# Patient Record
Sex: Female | Born: 1955
Health system: Southern US, Community
[De-identification: ages and names within clinical notes are randomized; demographics above are authoritative.]

## PROBLEM LIST (undated history)

## (undated) ENCOUNTER — Inpatient Hospital Stay (INDEPENDENT_AMBULATORY_CARE_PROVIDER_SITE_OTHER): Payer: 59 | Admitting: Podiatry

## (undated) DIAGNOSIS — E7800 Pure hypercholesterolemia, unspecified: Secondary | ICD-10-CM

## (undated) DIAGNOSIS — I10 Essential (primary) hypertension: Secondary | ICD-10-CM

## (undated) HISTORY — PX: FOOT SURGERY: SHX648

---

## 2003-03-28 ENCOUNTER — Other Ambulatory Visit: Admission: RE | Admit: 2003-03-28 | Discharge: 2003-03-28 | Payer: Self-pay | Admitting: Obstetrics and Gynecology

## 2003-04-16 ENCOUNTER — Ambulatory Visit (HOSPITAL_COMMUNITY): Admission: RE | Admit: 2003-04-16 | Discharge: 2003-04-16 | Payer: Self-pay | Admitting: Obstetrics and Gynecology

## 2003-04-16 ENCOUNTER — Encounter: Payer: Self-pay | Admitting: Obstetrics and Gynecology

## 2003-05-14 ENCOUNTER — Encounter (INDEPENDENT_AMBULATORY_CARE_PROVIDER_SITE_OTHER): Payer: Self-pay | Admitting: Specialist

## 2003-05-14 ENCOUNTER — Ambulatory Visit (HOSPITAL_COMMUNITY): Admission: RE | Admit: 2003-05-14 | Discharge: 2003-05-14 | Payer: Self-pay | Admitting: Obstetrics and Gynecology

## 2006-09-16 ENCOUNTER — Encounter: Admission: RE | Admit: 2006-09-16 | Discharge: 2006-09-16 | Payer: Self-pay | Admitting: Orthopedic Surgery

## 2007-06-25 ENCOUNTER — Emergency Department (HOSPITAL_COMMUNITY): Admission: EM | Admit: 2007-06-25 | Discharge: 2007-06-25 | Payer: Self-pay | Admitting: Emergency Medicine

## 2008-02-16 ENCOUNTER — Observation Stay (HOSPITAL_COMMUNITY): Admission: EM | Admit: 2008-02-16 | Discharge: 2008-02-17 | Payer: Self-pay | Admitting: Emergency Medicine

## 2008-02-16 ENCOUNTER — Encounter (INDEPENDENT_AMBULATORY_CARE_PROVIDER_SITE_OTHER): Payer: Self-pay | Admitting: Surgery

## 2010-12-21 ENCOUNTER — Other Ambulatory Visit (HOSPITAL_COMMUNITY)
Admission: RE | Admit: 2010-12-21 | Discharge: 2010-12-21 | Disposition: A | Discharge status: Home / Self Care | Payer: 59 | Source: Ambulatory Visit | Attending: Pediatric Emergency Medicine | Admitting: Pediatric Emergency Medicine

## 2010-12-21 ENCOUNTER — Other Ambulatory Visit: Payer: Self-pay | Admitting: Family Medicine

## 2010-12-21 DIAGNOSIS — Z124 Encounter for screening for malignant neoplasm of cervix: Secondary | ICD-10-CM | POA: Insufficient documentation

## 2010-12-29 NOTE — Op Note (Signed)
Shelia Sanford, Shelia Sanford NO.:  [id number]   MEDICAL RECORD NO.:  [id number]          PATIENT TYPE:  OBV   LOCATION:  5126                         FACILITY:  [facility]   PHYSICIAN:  Velora Heckler, MD      DATE OF BIRTH:  [birth year]   DATE OF PROCEDURE:  DATE OF DISCHARGE:                               OPERATIVE REPORT   PREOPERATIVE DIAGNOSIS:  Acute appendicitis.   POSTOPERATIVE DIAGNOSIS:  Acute appendicitis.   PROCEDURE:  Laparoscopic appendectomy.   SURGEON:  Velora Heckler, MD, FACS   ANESTHESIA:  General per Dr. Laverle Hobby.   ESTIMATED BLOOD LOSS:  Minimal.   PREPARATION:  Betadine.   COMPLICATIONS:  None.   INDICATIONS:  The patient is a 55 year old white female presents to the  emergency department with 2-day history of nausea, right-sided abdominal  pain, and fever.  The patient was evaluated and noted to have elevated  white blood cell count.  CT scan of the abdomen and pelvis confirmed a  diagnosis of acute appendicitis.  General surgery was called for  management.   BODY OF REPORT:  Procedure was done in OR #70 at the [facility].  The patient was brought to the operating room,  placed in supine position on the operating room table.  Following  administration of general anesthesia, the patient was prepped and draped  in the usual strict aseptic fashion.  After ascertaining that an  adequate level of anesthesia had been achieved, an infraumbilical  incision was made in the midline with #15 blade.  Dissection was carried  down to the fascia.  Fascia was incised in the midline, and the  peritoneal cavity was entered cautiously.  A 0 Vicryl pursestring suture  was placed in the fascia.  A Hasson cannula was introduced and secured  with a pursestring suture.  Abdomen was insufflated with carbon dioxide.  Laparoscope was introduced in the abdomen and explored.  Operative ports  were placed in the right upper quadrant and left lower  quadrant.  The  patient was positioned and the omentum was mobilized out of the right  lower quadrant of the abdomen.  Appendix was visualized.  It was acutely  inflamed and indurated.  There was no sign of perforation nor abscess.  Mesoappendix was taken down using the harmonic scalpel for hemostasis.  Appendix was dissected up to level of the cecal wall.  Base of the  appendix was dissected out again using the harmonic scalpel for  hemostasis.  Base of the appendix was then transected at its junction  with the cecal wall with an EndoGIA type stapler.  Good hemostasis was  noted at the staple line.  Good hemostasis was achieved along the  mesoappendix with the harmonic scalpel.  Appendix was placed into an  EndoCatch bag and withdrawn through the umbilical port without  difficulty.  Right lower quadrant was irrigated with warm saline.  Good  hemostasis was noted.  Saline was evacuated from the right lower  quadrant and pelvis.  Ports were removed under direct vision  and good  hemostasis was noted at the port sites.  A 0 Vicryl pursestring suture  was tied securely.  Port sites were anesthetized with local anesthetic.  Wounds were  closed with interrupted 4-0 Monocryl subcuticular sutures.  Wounds were  washed and dried and Benzoin and Steri-Strips were applied.  Sterile  dressings were applied.  The patient was awakened from anesthesia and  brought to the recovery room in stable condition.  The patient tolerated  the procedure well.      Velora Heckler, MD  Electronically Signed     TMG/MEDQ  D:  02/16/2008  T:  02/17/2008  Job:  [id number]   cc:   Dwana Curd. Para March, M.D.  [employer]

## 2010-12-29 NOTE — H&P (Signed)
NAMELITSY, EPTING NO.:  [id number]   MEDICAL RECORD NO.:  [id number]          PATIENT TYPE:  OBV   LOCATION:  5126                         FACILITY:  [facility]   PHYSICIAN:  Velora Heckler, MD      DATE OF BIRTH:  07-06-1956   DATE OF ADMISSION:  02/16/2008  DATE OF DISCHARGE:                              HISTORY & PHYSICAL   CHIEF COMPLAINT:  Abdominal pain, acute appendicitis.   HISTORY OF PRESENT ILLNESS:  The patient is a 55 year old white female  from [place], [place].  She presents to the emergency  department with 2-day history of developing right-sided abdominal pain  and nausea.  The patient developed fever to 101.7 degrees at home this  morning.  She has had intermittent episodes of diarrhea for  approximately 1 week.  The patient was seen and evaluated in the  emergency department.  Laboratory studies were obtained showing an  elevated white blood cell count of 11.9 with a left shift.  CT scan  abdomen and pelvis was obtained with findings consistent with acute  appendicitis.  General Surgery is called for management.   PAST MEDICAL HISTORY:  Status post cesarean section, status post removal  of fibroid tumors, status post foot surgery, history of hypertension,  and history of hypercholesterolemia.   MEDICATIONS:  Xanax, lisinopril, amlodipine, and simvastatin.   ALLERGIES:  To SULFA causing tongue swelling.   SOCIAL HISTORY:  The patient is married with one child.  She does not  smoke.  She drinks wine on a daily basis.  She works at [employer] in  [place].   PRIMARY DOCTOR:  Dr. Crawford Givens at [facility].   FAMILY HISTORY:  Noncontributory.   REVIEW OF SYSTEMS:  15 system reviewed without significant other finding  except as noted above.   PHYSICAL EXAMINATION:  GENERAL:  A 55 year old moderately obese white  female in mild discomfort, on a stretcher in the emergency department.  VITAL SIGNS:  Temperature 98.8,  pulse 107, respirations 18, blood  pressure 124/64.  HEENT:  Normocephalic and atraumatic.  Sclerae clear.  Conjunctivae  clear.  Pupils equal and reactive.  Dentition good.  Mucous membranes  moist.  Voice normal.  NECK:  Palpation of the anterior neck shows no thyroid nodularity.  No  lymphadenopathy.  No tenderness.  Peripheral pulses are full.  EXTREMITIES:  Nontender without edema.  CHEST:  Auscultation of the chest shows good breath sounds bilaterally  without rales, rhonchi, or wheeze.  CARDIAC:  Regular rate and rhythm without murmur.  ABDOMEN:  Soft, obese with well-healed lower midline incision.  Palpation of the upper abdomen shows no significant tenderness.  However, in the right mid and lower abdomen, there is obvious tenderness  with voluntary guarding.  No palpable mass.  No sign of hernia.  No  hepatosplenomegaly.  NEUROLOGICAL:  The patient is alert and oriented without focal deficit.   LABORATORY STUDIES:  White count 11.9, hemoglobin 13.8, hematocrit  39.4%, platelet count 271,000.  Differential shows 86% neutrophils, 8%  lymphocytes, 5% monocytes.  Electrolytes are  normal.  Creatinine 0.8.  Urine pregnancy test negative.  Urinalysis negative.   RADIOGRAPHIC STUDIES:  CT scan of abdomen and pelvis showing enhancement  of the appendiceal wall and fluid attenuation centrally.  There is cecal  thickening.  There is periappendiceal inflammation, all of which is felt  to be consistent with acute appendicitis.   IMPRESSION:  Acute appendicitis.   PLAN:  The patient will be admitted on the General Surgical Service at  [facility].  The patient will be started on intravenous  antibiotics.  Operating room has been notified, and the patient will be  prepared urgently for surgery for laparoscopic appendectomy.  The  patient and her family and I have discussed laparoscopic technique  versus open surgery.  I quoted them approximately a 10% chance of  conversion to  open surgery.  We discussed her hospital stay and her  return to work.  She understands and wishes to proceed.      Velora Heckler, MD  Electronically Signed     TMG/MEDQ  D:  02/16/2008  T:  02/16/2008  Job:  [id number]   cc:   Jettie Pagan, MD  Dwana Curd Para March, M.D.

## 2011-01-01 NOTE — Op Note (Signed)
NAME:  Shelia Sanford, Shelia Sanford                             ACCOUNT NO.:  [id number]   MEDICAL RECORD NO.:  [id number]                   PATIENT TYPE:  AMB   LOCATION:  SDC                                  [facility]AC[facility]:  [facility]   PHYSICIAN:  Richardean Sale, M.D.                DATE OF BIRTH:  Sep 29, 1955   DATE OF PROCEDURE:  05/14/2003  DATE OF DISCHARGE:                                 OPERATIVE REPORT   PREOPERATIVE DIAGNOSIS:  Menomenorrhagia secondary to submucosal uterine  fibroids.   POSTOPERATIVE DIAGNOSIS:  Menomenorrhagia secondary to submucosal uterine  fibroids.   PROCEDURE:  Hysteroscopy with resection of submucosal fibroid and D&C.   FINDINGS:  A 2 cm submucosal fibroid in the right cornua, small benign-  appearing endometrial polyps.  Uterus sounds to 9.5 cm.   INDICATIONS FOR PROCEDURE:  This is a 55 year old white female with a recent  history of menomenorrhagia with menses occurring every two weeks and being  extremely heavy, soaking through pads and through tampons.  Sonohistogram  was performed at the office which revealed a submucosal fibroid.  Endometrial biopsy was performed and was negative for any premalignant or  malignant cells.  Therefore, the patient was offered hysterectomy versus  hysteroscopic resection and desired outpatient management.  Prior to the  procedure, the risks of the procedure including, but not limited to,  hemorrhage, infection, injury to the uterus requiring further surgery,  possible exploratory laparotomy to rule out gallbladder or other intra-  abdominal organ injury, DVT and anesthetic related complications as well as  a 50% chance of recurrence of the fibroid and symptoms.  The patient voices  understanding of these risks, consent was signed and all questions were  answered.   DESCRIPTION OF PROCEDURE:  The patient was taken to the operating room and  given general anesthesia, was placed in the dorsal lithotomy position and  prepped and draped  in the usual sterile fashion with Hibiclens.  A red  rubber catheter was then used to drain the bladder.  A speculum was placed  in the vagina and 12 mL of Nesacaine were injected circumferentially for a  paracervical block for postoperative analgesia.  A single-tooth tenaculum  was placed on the anterior lip of the cervix and the cervix was then dilated  with the [employer] dilators.  The hysteroscope was then introduced using 3%  Sorbitol and findings were revealed as noted above.  At this point, the  double wire loop was used to resect the uterine fibroid.  This was performed  without much difficulty.  Small endometrial polyps were removed as well.  The resection was completed.  This was followed by sharp curettage with the  curettings sent to pathology.  The single-tooth tenaculum was removed from  the cervix.  There was no bleeding from the tenaculum site and very minimal  bleeding from the os.  The patient tolerated the  procedure well.  Sponge,  lap and instrument counts correct x 2.  Fluid deficit was 220 mL per  recording on the machine but there was approximately 100 mL of fluid on the  operating room floor, the total deficit was then estimated at 120 mL.   The patient tolerated the procedure well.  She was taken out of general  anesthesia and taken to the recovery room in stable condition.  There were  no complications.                                               Richardean Sale, M.D.   JW/MEDQ  D:  05/14/2003  T:  05/14/2003  Job:  [id number]

## 2011-01-01 NOTE — H&P (Signed)
NAME:  Shelia Sanford, Shelia Sanford                             ACCOUNT NO.:  [id number]   MEDICAL RECORD NO.:  [id number]                   PATIENT TYPE:  AMB   LOCATION:  SDC                                  [facility]AC[facility]   PHYSICIAN:  Richardean Sale, M.D.                DATE OF BIRTH:  1956/02/02   DATE OF ADMISSION:  05/14/2003  DATE OF DISCHARGE:                                HISTORY & PHYSICAL   CHIEF COMPLAINT:  Irregular menstrual cycles with heavy bleeding.   HISTORY OF PRESENT ILLNESS:  This is a 54 year old gravida 1, para 1 white  female with a six month history of increasing menstrual flow and menstrual  cycle frequency which has been refractory to hormonal therapy.  The patient  underwent an ultrasound and a sonohysterogram which revealed a 2 cm  submucosal fibroid at the uterine fundus as well as a possible small polyp.  Endometrial biopsy was performed which was negative for any atypia or  malignancy.  Ultrasound also revealed three small anterior uterine fibroids  that did not appear submucosal and are all less than 2 cm in size.  The  patient's bleeding is affecting her daily life.  She occasionally bleeds  through both on maxi pad and tampon and complains of excessive fatigue.  Recent Pap smear was normal.  Mammogram was also normal.   PAST MEDICAL HISTORY:  None.   PAST SURGICAL HISTORY:  1. Cesarean section.  2. Tonsillectomy.   SOCIAL HISTORY:  Does not smoke.  Uses only occasional alcohol socially.  No  illegal drugs.  She is married.   FAMILY HISTORY:  Significant for mother with lung cancer, grandmother with  ALS, and grandfather with lymphoma.  No history of blood clotting or  bleeding disorders.  No breast, ovarian, or uterine cancer.   MEDICATIONS:  None.   ALLERGIES:  SULFA.   REVIEW OF SYSTEMS:  No chest pain, shortness of breath, abdominal pain.  No  changes in bowel or bladder habits.  All negative with the exception  bleeding noted in the HPI.   PHYSICAL EXAMINATION:  GENERAL:  She is a well-developed, well-nourished  white female who appears in no acute distress.  NECK:  Neck and thyroid are within normal limits.  LUNGS:  Clear to auscultation bilaterally.  CARDIOVASCULAR:  Heart is regular rate and rhythm.  No murmurs, rubs, or  gallops appreciated.  ABDOMEN:  Soft, nontender, nondistended.  There is a healed cesarean section  scar and no appreciable masses.  No hernia.  Liver, spleen are normal.  PELVIC:  External genitalia are within normal limits.  Speculum examination  reveals a normal appearing cervix with a moderate amount of blood in the  vagina.  Bimanual examination:  There is no cervical motion tenderness.  Uterus is anteverted, normal size, mobile, and nontender.  There are no  adnexal masses.  No adnexal tenderness.  EXTREMITIES:  No edema, nontender.  NEUROLOGIC:  Nonfocal.   ASSESSMENT:  A 55 year old gravida 1, para 1 white female with  menometrorrhagia likely due to submucosal fibroids and/or endometrial polyp.   PLAN:  Will proceed with hysteroscopy with resection of uterine fibroids  and/or polyps as well as ablation of the endometrium.                                               Richardean Sale, M.D.    JW/MEDQ  D:  05/10/2003  T:  05/10/2003  Job:  [id number]

## 2011-05-13 LAB — URINALYSIS, ROUTINE W REFLEX MICROSCOPIC
Glucose, UA: NEGATIVE
pH: 6.5

## 2011-05-13 LAB — CBC
HCT: 39.4
Platelets: 271
RBC: 4.41
WBC: 11.9 — ABNORMAL HIGH

## 2011-05-13 LAB — POCT I-STAT, CHEM 8
Calcium, Ion: 1.15
HCT: 42
Sodium: 135
TCO2: 25

## 2011-05-13 LAB — DIFFERENTIAL
Lymphocytes Relative: 8 — ABNORMAL LOW
Lymphs Abs: 1
Monocytes Relative: 5
Neutrophils Relative %: 86 — ABNORMAL HIGH

## 2011-05-13 LAB — PREGNANCY, URINE: Preg Test, Ur: NEGATIVE

## 2014-06-13 ENCOUNTER — Ambulatory Visit (HOSPITAL_COMMUNITY)
Admission: RE | Admit: 2014-06-13 | Discharge: 2014-06-13 | Disposition: A | Discharge status: Home / Self Care | Payer: 59 | Source: Ambulatory Visit | Attending: Chiropractic Medicine | Admitting: Chiropractic Medicine

## 2014-06-13 ENCOUNTER — Ambulatory Visit (HOSPITAL_COMMUNITY)
Admission: RE | Admit: 2014-06-13 | Discharge: 2014-06-13 | Disposition: A | Discharge status: Home / Self Care | Payer: 59 | Source: Ambulatory Visit | Attending: Diagnostic Radiology | Admitting: Diagnostic Radiology

## 2014-06-13 ENCOUNTER — Other Ambulatory Visit (HOSPITAL_COMMUNITY): Payer: Self-pay | Admitting: Chiropractic Medicine

## 2014-06-13 DIAGNOSIS — M545 Low back pain: Secondary | ICD-10-CM | POA: Insufficient documentation

## 2014-06-13 DIAGNOSIS — M25551 Pain in right hip: Secondary | ICD-10-CM | POA: Diagnosis present

## 2014-08-11 ENCOUNTER — Encounter (HOSPITAL_COMMUNITY): Payer: Self-pay | Admitting: *Deleted

## 2014-08-11 ENCOUNTER — Emergency Department (HOSPITAL_COMMUNITY)
Admission: EM | Admit: 2014-08-11 | Discharge: 2014-08-11 | Disposition: A | Discharge status: Home / Self Care | Payer: 59 | Attending: Emergency Medicine | Admitting: Emergency Medicine

## 2014-08-11 DIAGNOSIS — E780 Pure hypercholesterolemia: Secondary | ICD-10-CM | POA: Insufficient documentation

## 2014-08-11 DIAGNOSIS — Y998 Other external cause status: Secondary | ICD-10-CM | POA: Diagnosis not present

## 2014-08-11 DIAGNOSIS — Z79899 Other long term (current) drug therapy: Secondary | ICD-10-CM | POA: Diagnosis not present

## 2014-08-11 DIAGNOSIS — W260XXA Contact with knife, initial encounter: Secondary | ICD-10-CM | POA: Diagnosis not present

## 2014-08-11 DIAGNOSIS — Y9389 Activity, other specified: Secondary | ICD-10-CM | POA: Diagnosis not present

## 2014-08-11 DIAGNOSIS — I10 Essential (primary) hypertension: Secondary | ICD-10-CM | POA: Insufficient documentation

## 2014-08-11 DIAGNOSIS — Z791 Long term (current) use of non-steroidal anti-inflammatories (NSAID): Secondary | ICD-10-CM | POA: Insufficient documentation

## 2014-08-11 DIAGNOSIS — S61211A Laceration without foreign body of left index finger without damage to nail, initial encounter: Secondary | ICD-10-CM | POA: Diagnosis not present

## 2014-08-11 DIAGNOSIS — Y9289 Other specified places as the place of occurrence of the external cause: Secondary | ICD-10-CM | POA: Diagnosis not present

## 2014-08-11 DIAGNOSIS — S61219A Laceration without foreign body of unspecified finger without damage to nail, initial encounter: Secondary | ICD-10-CM

## 2014-08-11 HISTORY — DX: Pure hypercholesterolemia, unspecified: E78.00

## 2014-08-11 HISTORY — DX: Essential (primary) hypertension: I10

## 2014-08-11 MED ORDER — LIDOCAINE HCL 1 % IJ SOLN
30.0000 mL | Freq: Once | INTRAMUSCULAR | Status: AC
  Administered 2014-08-11: 30 mL
  Filled 2014-08-11: qty 40

## 2014-08-11 NOTE — ED Provider Notes (Signed)
CSN: [id number]     Arrival date & time 08/11/14  1300 History   None    Chief Complaint  Patient presents with  . finger laceration    The history is provided by the patient. No language interpreter was used.   This chart was scribed for non-physician practitioner Marlon Peliffany Rubena Roseman, PA-C,  working with Flint MelterElliott L Wentz, MD, by Andrew Auaven Small, ED Scribe. This patient was seen in room WTR6/WTR6 and the patient's care was started at 2:18 PM.  Wallis MartHolly W Ariel is a 58 y.o. female who presents to the Emergency Department complaining of left pointer finger laceration. Pt cut left pointer finger with a knife while cutting cooked [place]. Pt reports finger has been numb but states sensation is gradually returning. Pt denies being on blood thinners, aspirin and ibuprofen.   Past Medical History  Diagnosis Date  . Hypertension   . High cholesterol    Past Surgical History  Procedure Laterality Date  . Foot surgery      right foot, 2010   History reviewed. No pertinent family history. History  Substance Use Topics  . Smoking status: Never Smoker   . Smokeless tobacco: Not on file  . Alcohol Use: Yes   OB History    No data available     Review of Systems  Skin: Positive for wound.   Allergies  Sulfa antibiotics  Home Medications   Prior to Admission medications   Medication Sig Start Date End Date Taking? Authorizing Provider  ALPRAZolam Prudy Feeler(XANAX) 1 MG tablet Take 0.5-1 mg by mouth 3 (three) times daily as needed for anxiety.  07/31/14  Yes Historical Provider, MD  amLODipine (NORVASC) 5 MG tablet Take 5 mg by mouth daily. 07/31/14  Yes Historical Provider, MD  atorvastatin (LIPITOR) 20 MG tablet Take 20 mg by mouth daily. 07/26/14  Yes Historical Provider, MD  lisinopril-hydrochlorothiazide (PRINZIDE,ZESTORETIC) 20-25 MG per tablet Take 1 tablet by mouth daily. 07/26/14  Yes Historical Provider, MD  nabumetone (RELAFEN) 500 MG tablet Take 500 mg by mouth 2 (two) times daily. 07/22/14  Yes  Historical Provider, MD   BP 115/63 mmHg  Pulse 83  Temp(Src) 98.6 F (37 C) (Oral)  Resp 18  Ht 5\' 3"  (1.6 m)  SpO2 98% Physical Exam  Constitutional: She is oriented to person, place, and time. She appears well-developed and well-nourished. No distress.  HENT:  Head: Normocephalic and atraumatic.  Eyes: Conjunctivae and EOM are normal.  Neck: Neck supple.  Cardiovascular: Normal rate.   Pulmonary/Chest: Effort normal.  Musculoskeletal:       Left hand: She exhibits tenderness and laceration (small 1.5 cm laceration). She exhibits normal range of motion, no bony tenderness, normal two-point discrimination, normal capillary refill, no deformity and no swelling. Normal sensation noted. Normal strength noted.  Neurological: She is alert and oriented to person, place, and time.  Skin: Skin is warm and dry.  Psychiatric: She has a normal mood and affect. Her behavior is normal.  Nursing note and vitals reviewed.  ED Course  Procedures (including critical care time) DIAGNOSTIC STUDIES: Oxygen Saturation is 98% on RA, normal by my interpretation.    COORDINATION OF CARE: 2:18 PM- Pt advised of plan for treatment and pt agrees.  LACERATION REPAIR PROCEDURE NOTE The patient's identification was confirmed and consent was obtained. This procedure was performed by Marlon Peliffany Sahil Milner, PA-C at 2:18 PM. Site: left pointer finger Sterile procedures observedyes Anesthetic used (type and amt): xylocaine 1 % Suture type/size: 4-0 prolene Length: 1.5  cm # of Sutures: 3 Technique:simple interrupted Complexitysimple Antibx ointment appliedyes Tetanus UTD or ordered: Current Site anesthetized, irrigated with NS, explored without evidence of foreign body, wound well approximated, site covered with dry, sterile dressing.  Patient tolerated procedure well without complications. Instructions for care discussed verbally and patient provided with additional written instructions for homecare and  f/u.  Labs Review Labs Reviewed - No data to display  Imaging Review No results found.   EKG Interpretation None      MDM   Final diagnoses:  Finger laceration, initial encounter    Suture removal in 7-10 days. Wound care info given. Finger splint applied.  58 y.o.Meyer CoryHolly W Woerner's evaluation in the Emergency Department is complete. It has been determined that no acute conditions requiring further emergency intervention are present at this time. The patient/guardian have been advised of the diagnosis and plan. We have discussed signs and symptoms that warrant return to the ED, such as changes or worsening in symptoms.  Vital signs are stable at discharge. Filed Vitals:   08/11/14 1304  BP: 115/63  Pulse: 83  Temp: 98.6 F (37 C)  Resp: 18    Patient/guardian has voiced understanding and agreed to follow-up with the PCP or specialist.   I personally performed the services described in this documentation, which was scribed in my presence. The recorded information has been reviewed and is accurate.    Dorthula Matasiffany G Avin Upperman, PA-C 08/11/14 1419  Flint MelterElliott L Wentz, MD 08/11/14 [phone]

## 2014-08-11 NOTE — Discharge Instructions (Signed)
Cast or Splint Care Casts and splints support injured limbs and keep bones from moving while they heal. It is important to care for your cast or splint at home.  HOME CARE INSTRUCTIONS  Keep the cast or splint uncovered during the drying period. It can take 24 to 48 hours to dry if it is made of plaster. A fiberglass cast will dry in less than 1 hour.  Do not rest the cast on anything harder than a pillow for the first 24 hours.  Do not put weight on your injured limb or apply pressure to the cast until your health care provider gives you permission.  Keep the cast or splint dry. Wet casts or splints can lose their shape and may not support the limb as well. A wet cast that has lost its shape can also create harmful pressure on your skin when it dries. Also, wet skin can become infected.  Cover the cast or splint with a plastic bag when bathing or when out in the rain or snow. If the cast is on the trunk of the body, take sponge baths until the cast is removed.  If your cast does become wet, dry it with a towel or a blow dryer on the cool setting only.  Keep your cast or splint clean. Soiled casts may be wiped with a moistened cloth.  Do not place any hard or soft foreign objects under your cast or splint, such as cotton, toilet paper, lotion, or powder.  Do not try to scratch the skin under the cast with any object. The object could get stuck inside the cast. Also, scratching could lead to an infection. If itching is a problem, use a blow dryer on a cool setting to relieve discomfort.  Do not trim or cut your cast or remove padding from inside of it.  Exercise all joints next to the injury that are not immobilized by the cast or splint. For example, if you have a long leg cast, exercise the hip joint and toes. If you have an arm cast or splint, exercise the shoulder, elbow, thumb, and fingers.  Elevate your injured arm or leg on 1 or 2 pillows for the first 1 to 3 days to decrease  swelling and pain.It is best if you can comfortably elevate your cast so it is higher than your heart. SEEK MEDICAL CARE IF:   Your cast or splint cracks.  Your cast or splint is too tight or too loose.  You have unbearable itching inside the cast.  Your cast becomes wet or develops a soft spot or area.  You have a bad smell coming from inside your cast.  You get an object stuck under your cast.  Your skin around the cast becomes red or raw.  You have new pain or worsening pain after the cast has been applied. SEEK IMMEDIATE MEDICAL CARE IF:   You have fluid leaking through the cast.  You are unable to move your fingers or toes.  You have discolored (blue or white), cool, painful, or very swollen fingers or toes beyond the cast.  You have tingling or numbness around the injured area.  You have severe pain or pressure under the cast.  You have any difficulty with your breathing or have shortness of breath.  You have chest pain. Document Released: 07/30/2000 Document Revised: 05/23/2013 Document Reviewed: 02/08/2013 [facility] Patient Information 2015 [place], [place]. This information is not intended to replace advice given to you by your health care  provider. Make sure you discuss any questions you have with your health care provider. Laceration Care, Adult A laceration is a cut or lesion that goes through all layers of the skin and into the tissue just beneath the skin. TREATMENT  Some lacerations may not require closure. Some lacerations may not be able to be closed due to an increased risk of infection. It is important to see your caregiver as soon as possible after an injury to minimize the risk of infection and maximize the opportunity for successful closure. If closure is appropriate, pain medicines may be given, if needed. The wound will be cleaned to help prevent infection. Your caregiver will use stitches (sutures), staples, wound glue (adhesive), or skin adhesive strips  to repair the laceration. These tools bring the skin edges together to allow for faster healing and a better cosmetic outcome. However, all wounds will heal with a scar. Once the wound has healed, scarring can be minimized by covering the wound with sunscreen during the day for 1 full year. HOME CARE INSTRUCTIONS  For sutures or staples:  Keep the wound clean and dry.  If you were given a bandage (dressing), you should change it at least once a day. Also, change the dressing if it becomes wet or dirty, or as directed by your caregiver.  Wash the wound with soap and water 2 times a day. Rinse the wound off with water to remove all soap. Pat the wound dry with a clean towel.  After cleaning, apply a thin layer of the antibiotic ointment as recommended by your caregiver. This will help prevent infection and keep the dressing from sticking.  You may shower as usual after the first 24 hours. Do not soak the wound in water until the sutures are removed.  Only take over-the-counter or prescription medicines for pain, discomfort, or fever as directed by your caregiver.  Get your sutures or staples removed as directed by your caregiver. For skin adhesive strips:  Keep the wound clean and dry.  Do not get the skin adhesive strips wet. You may bathe carefully, using caution to keep the wound dry.  If the wound gets wet, pat it dry with a clean towel.  Skin adhesive strips will fall off on their own. You may trim the strips as the wound heals. Do not remove skin adhesive strips that are still stuck to the wound. They will fall off in time. For wound adhesive:  You may briefly wet your wound in the shower or bath. Do not soak or scrub the wound. Do not swim. Avoid periods of heavy perspiration until the skin adhesive has fallen off on its own. After showering or bathing, gently pat the wound dry with a clean towel.  Do not apply liquid medicine, cream medicine, or ointment medicine to your wound  while the skin adhesive is in place. This may loosen the film before your wound is healed.  If a dressing is placed over the wound, be careful not to apply tape directly over the skin adhesive. This may cause the adhesive to be pulled off before the wound is healed.  Avoid prolonged exposure to sunlight or tanning lamps while the skin adhesive is in place. Exposure to ultraviolet light in the first year will darken the scar.  The skin adhesive will usually remain in place for 5 to 10 days, then naturally fall off the skin. Do not pick at the adhesive film. You may need a tetanus shot if:  You cannot  remember when you had your last tetanus shot.  You have never had a tetanus shot. If you get a tetanus shot, your arm may swell, get red, and feel warm to the touch. This is common and not a problem. If you need a tetanus shot and you choose not to have one, there is a rare chance of getting tetanus. Sickness from tetanus can be serious. SEEK MEDICAL CARE IF:   You have redness, swelling, or increasing pain in the wound.  You see a red line that goes away from the wound.  You have yellowish-white fluid (pus) coming from the wound.  You have a fever.  You notice a bad smell coming from the wound or dressing.  Your wound breaks open before or after sutures have been removed.  You notice something coming out of the wound such as wood or glass.  Your wound is on your hand or foot and you cannot move a finger or toe. SEEK IMMEDIATE MEDICAL CARE IF:   Your pain is not controlled with prescribed medicine.  You have severe swelling around the wound causing pain and numbness or a change in color in your arm, hand, leg, or foot.  Your wound splits open and starts bleeding.  You have worsening numbness, weakness, or loss of function of any joint around or beyond the wound.  You develop painful lumps near the wound or on the skin anywhere on your body. MAKE SURE YOU:   Understand these  instructions.  Will watch your condition.  Will get help right away if you are not doing well or get worse. Document Released: 08/02/2005 Document Revised: 10/25/2011 Document Reviewed: 01/26/2011 [facility] Patient Information 2015 [place], [place]. This information is not intended to replace advice given to you by your health care provider. Make sure you discuss any questions you have with your health care provider.

## 2014-08-11 NOTE — ED Notes (Addendum)
Pt reports she cut her left pointer finger with knife while cutting food. Laceration slightly bleeding. Radial pulse strong. Reports finger is numb. Now finger pain 4/10.  Tetanus shot 5 years ago.

## 2014-11-15 ENCOUNTER — Other Ambulatory Visit: Payer: Self-pay | Admitting: Neurosurgery

## 2014-11-15 DIAGNOSIS — M5416 Radiculopathy, lumbar region: Secondary | ICD-10-CM

## 2015-09-04 ENCOUNTER — Encounter (HOSPITAL_COMMUNITY): Payer: Self-pay | Admitting: Emergency Medicine

## 2015-09-04 ENCOUNTER — Emergency Department (HOSPITAL_COMMUNITY): Payer: 59

## 2015-09-04 ENCOUNTER — Emergency Department (HOSPITAL_COMMUNITY)
Admission: EM | Admit: 2015-09-04 | Discharge: 2015-09-04 | Disposition: A | Discharge status: Home / Self Care | Payer: 59 | Attending: Emergency Medicine | Admitting: Emergency Medicine

## 2015-09-04 DIAGNOSIS — Z79899 Other long term (current) drug therapy: Secondary | ICD-10-CM | POA: Insufficient documentation

## 2015-09-04 DIAGNOSIS — W010XXA Fall on same level from slipping, tripping and stumbling without subsequent striking against object, initial encounter: Secondary | ICD-10-CM | POA: Diagnosis not present

## 2015-09-04 DIAGNOSIS — M25562 Pain in left knee: Secondary | ICD-10-CM

## 2015-09-04 DIAGNOSIS — S8991XA Unspecified injury of right lower leg, initial encounter: Secondary | ICD-10-CM | POA: Diagnosis present

## 2015-09-04 DIAGNOSIS — Y998 Other external cause status: Secondary | ICD-10-CM | POA: Insufficient documentation

## 2015-09-04 DIAGNOSIS — Y9389 Activity, other specified: Secondary | ICD-10-CM | POA: Insufficient documentation

## 2015-09-04 DIAGNOSIS — I10 Essential (primary) hypertension: Secondary | ICD-10-CM | POA: Diagnosis not present

## 2015-09-04 DIAGNOSIS — M25561 Pain in right knee: Secondary | ICD-10-CM

## 2015-09-04 DIAGNOSIS — S8002XA Contusion of left knee, initial encounter: Secondary | ICD-10-CM | POA: Diagnosis not present

## 2015-09-04 DIAGNOSIS — Z7982 Long term (current) use of aspirin: Secondary | ICD-10-CM | POA: Insufficient documentation

## 2015-09-04 DIAGNOSIS — Y9289 Other specified places as the place of occurrence of the external cause: Secondary | ICD-10-CM | POA: Diagnosis not present

## 2015-09-04 DIAGNOSIS — E7800 Pure hypercholesterolemia, unspecified: Secondary | ICD-10-CM | POA: Diagnosis not present

## 2015-09-04 DIAGNOSIS — S8001XA Contusion of right knee, initial encounter: Secondary | ICD-10-CM | POA: Diagnosis not present

## 2015-09-04 DIAGNOSIS — S8000XA Contusion of unspecified knee, initial encounter: Secondary | ICD-10-CM

## 2015-09-04 MED ORDER — IBUPROFEN 800 MG PO TABS: 800.0000 mg | ORAL_TABLET | Freq: Three times a day (TID) | ORAL | Status: AC

## 2015-09-04 MED ORDER — ACETAMINOPHEN 325 MG PO TABS
650.0000 mg | ORAL_TABLET | Freq: Once | ORAL | Status: AC
  Administered 2015-09-04: 650 mg via ORAL
  Filled 2015-09-04: qty 2

## 2015-09-04 MED ORDER — IBUPROFEN 800 MG PO TABS
800.0000 mg | ORAL_TABLET | Freq: Once | ORAL | Status: AC
  Administered 2015-09-04: 800 mg via ORAL
  Filled 2015-09-04: qty 1

## 2015-09-04 NOTE — ED Provider Notes (Signed)
CSN: [id number]     Arrival date & time 09/04/15  0554 History   First MD Initiated Contact with Patient 09/04/15 [phone]     Chief Complaint  Patient presents with  . Knee Injury    bilateral knees     (Consider location/radiation/quality/duration/timing/severity/associated sxs/prior Treatment) Patient is a 60 y.o. female presenting with knee pain. The history is provided by the patient.  Knee Pain Location:  Knee (bilateral) Time since incident: just PTA. Injury: yes   Mechanism of injury: fall   Fall:    Fall occurred:  Standing   Impact surface:  Primary school teacher of impact:  Knees Pain details:    Quality:  Burning   Radiates to:  Does not radiate   Severity:  Moderate Prior injury to area:  No Relieved by:  None tried Worsened by:  Bearing weight Ineffective treatments:  None tried Associated symptoms: swelling and tingling   Associated symptoms: no back pain, no decreased ROM, no fever, no muscle weakness, no numbness and no stiffness    Shelia Sanford is a 60 y.o. female with PMH significant for HTN, cholesterolemia who presents with bilateral knee pain after tripping and falling just PTA.  No LOC or head injury.  Patient ambulatory.  No numbness or weakness.  No meds PTA.  Past Medical History  Diagnosis Date  . Hypertension   . High cholesterol    Past Surgical History  Procedure Laterality Date  . Foot surgery      right foot, 2010   No family history on file. Social History  Substance Use Topics  . Smoking status: Never Smoker   . Smokeless tobacco: None  . Alcohol Use: Yes   OB History    No data available     Review of Systems  Constitutional: Negative for fever.  Musculoskeletal: Negative for back pain and stiffness.  Skin: Negative for wound.  Neurological: Negative for weakness and numbness.  All other systems reviewed and are negative.     Allergies  Sulfa antibiotics  Home Medications   Prior to Admission medications   Medication Sig  Start Date End Date Taking? Authorizing Provider  ALPRAZolam Prudy Feeler) 1 MG tablet Take 0.5-1 mg by mouth 3 (three) times daily as needed for anxiety.  07/31/14  Yes Historical Provider, MD  amLODipine (NORVASC) 5 MG tablet Take 5 mg by mouth daily. 07/31/14  Yes Historical Provider, MD  aspirin 325 MG tablet Take 325 mg by mouth daily.   Yes Historical Provider, MD  atorvastatin (LIPITOR) 20 MG tablet Take 20 mg by mouth daily. 07/26/14  Yes Historical Provider, MD  lisinopril-hydrochlorothiazide (PRINZIDE,ZESTORETIC) 20-25 MG per tablet Take 1 tablet by mouth daily. 07/26/14  Yes Historical Provider, MD  nabumetone (RELAFEN) 500 MG tablet Take 500 mg by mouth 2 (two) times daily. 07/22/14  Yes Historical Provider, MD  ibuprofen (ADVIL,MOTRIN) 800 MG tablet Take 1 tablet (800 mg total) by mouth 3 (three) times daily. 09/04/15   Kavan Devan, PA-C   BP 121/79 mmHg  Pulse 79  Temp(Src) 98.1 F (36.7 C) (Oral)  Resp 18  Ht  (1.6 m)  Wt 103.874 kg  BMI 40.58 kg/m2  SpO2 98% Physical Exam  Constitutional: She is oriented to person, place, and time. She appears well-developed and well-nourished.  HENT:  Head: Atraumatic.  Eyes: Conjunctivae are normal. No scleral icterus.  Neck: No tracheal deviation present.  Cardiovascular:  Pulses:      Posterior tibial pulses are 2+ on  the right side, and 2+ on the left side.  Pulmonary/Chest: Effort normal. No respiratory distress.  Musculoskeletal: Normal range of motion. She exhibits tenderness.  TTP along patellar ligaments, medial joint line, and lateral joint line bilaterally.  No quadriceps tendon tenderness.  Mild swelling noted bilaterally with mild bruising over anterior tibia.  Compartments soft and compressible.   Neurological: She is alert and oriented to person, place, and time.  Strength and sensation intact bilaterally throughout lower extremities.  Gait normal.  Skin: Skin is warm, dry and intact. Bruising (mild on bilateral anterior  knees) noted. No abrasion and no laceration noted.  Skin intact.  No abrasions or lacerations.  Psychiatric: She has a normal mood and affect. Her behavior is normal.    ED Course  Procedures (including critical care time) Labs Review Labs Reviewed - No data to display  Imaging Review Dg Knee Complete 4 Views Left  09/04/2015  CLINICAL DATA:  Fall.  Initial evaluation. EXAM: LEFT KNEE - COMPLETE 4+ VIEW COMPARISON:  MRI 06/17/2007. FINDINGS: No acute bony or joint abnormality identified. No evidence fracture or dislocation. Benign soft tissue calcifications noted. IMPRESSION: No acute abnormality. Electronically Signed   By: Maisie Fus  Register   On: 09/04/2015 07:10   Dg Knee Complete 4 Views Right  09/04/2015  CLINICAL DATA:  Tripped and this morning while walking dogs, falling onto concrete. Generalized bilateral knee pain. Initial encounter. EXAM: RIGHT KNEE - COMPLETE 4+ VIEW COMPARISON:  None. FINDINGS: There is no evidence of fracture, dislocation, or joint effusion. There is no evidence of arthropathy or other focal bone abnormality. Soft tissues are unremarkable. IMPRESSION: Negative. Electronically Signed   By: Sebastian Ache M.D.   On: 09/04/2015 07:06   I have personally reviewed and evaluated these images and lab results as part of my medical decision-making.   EKG Interpretation None      MDM   Final diagnoses:  Knee contusion, unspecified laterality, initial encounter  Arthralgia of both knees    Patient presents with bilateral knee pain after sustaining a fall from standing just prior to arrival. Patient is ambulatory. Mild bruising and swelling noted on the anterior knees bilaterally. She is neurovascularly intact. Compartments soft and compressible, no evidence of compartment syndrome.  Motrin and Tylenol given for pain. Plain films negative.  Discussed return precautions.  Follow up PCP in 1 week.  Patient agrees and acknowledges the above plan for discharge.    Cheri Fowler, PA-C 09/04/15 1610  Tilden Fossa, MD 09/04/15 2025

## 2015-09-04 NOTE — Discharge Instructions (Signed)
Contusion A contusion is a deep bruise. Contusions are the result of a blunt injury to tissues and muscle fibers under the skin. The injury causes bleeding under the skin. The skin overlying the contusion may turn blue, purple, or yellow. Minor injuries will give you a painless contusion, but more severe contusions may stay painful and swollen for a few weeks.  CAUSES  This condition is usually caused by a blow, trauma, or direct force to an area of the body. SYMPTOMS  Symptoms of this condition include:  Swelling of the injured area.  Pain and tenderness in the injured area.  Discoloration. The area may have redness and then turn blue, purple, or yellow. DIAGNOSIS  This condition is diagnosed based on a physical exam and medical history. An X-ray, CT scan, or MRI may be needed to determine if there are any associated injuries, such as broken bones (fractures). TREATMENT  Specific treatment for this condition depends on what area of the body was injured. In general, the best treatment for a contusion is resting, icing, applying pressure to (compression), and elevating the injured area. This is often called the RICE strategy. Over-the-counter anti-inflammatory medicines may also be recommended for pain control.  HOME CARE INSTRUCTIONS   Rest the injured area.  If directed, apply ice to the injured area:  Put ice in a plastic bag.  Place a towel between your skin and the bag.  Leave the ice on for 20 minutes, 2-3 times per day.  If directed, apply light compression to the injured area using an elastic bandage. Make sure the bandage is not wrapped too tightly. Remove and reapply the bandage as directed by your health care provider.  If possible, raise (elevate) the injured area above the level of your heart while you are sitting or lying down.  Take over-the-counter and prescription medicines only as told by your health care provider. SEEK MEDICAL CARE IF:  Your symptoms do not  improve after several days of treatment.  Your symptoms get worse.  You have difficulty moving the injured area. SEEK IMMEDIATE MEDICAL CARE IF:   You have severe pain.  You have numbness in a hand or foot.  Your hand or foot turns pale or cold.   This information is not intended to replace advice given to you by your health care provider. Make sure you discuss any questions you have with your health care provider.   Document Released: 05/12/2005 Document Revised: 04/23/2015 Document Reviewed: 12/18/2014 Elsevier Interactive Patient Education 2016 [employer].   Emergency Department Resource Guide 1) Find a Doctor and Pay Out of Pocket Although you won't have to find out who is covered by your insurance plan, it is a good idea to ask around and get recommendations. You will then need to call the office and see if the doctor you have chosen will accept you as a new patient and what types of options they offer for patients who are self-pay. Some doctors offer discounts or will set up payment plans for their patients who do not have insurance, but you will need to ask so you aren't surprised when you get to your appointment.  2) Contact Your Local Health Department Not all health departments have doctors that can see patients for sick visits, but many do, so it is worth a call to see if yours does. If you don't know where your local health department is, you can check in your phone book. The CDC also has a tool to help  locate your state's health department, and many state websites also have listings of all of their local health departments. ° °3) Find a Walk-in Clinic °If your illness is not likely to be very severe or complicated, you may want to try a walk in clinic. These are popping up all over the country in pharmacies, drugstores, and shopping centers. They're usually staffed by nurse practitioners or physician assistants that have been trained to treat common illnesses and complaints.  They're usually fairly quick and inexpensive. However, if you have serious medical issues or chronic medical problems, these are probably not your best option. ° °No Primary Care Doctor: °- Call Health Connect at  832-8000 - they can help you locate a primary care doctor that  accepts your insurance, provides certain services, etc. °- Physician Referral Service- 1-800-533-3463 ° °Chronic Pain Problems: °Organization         Address  Phone   Notes  °[facility]  (336) 297-2271 Patients need to be referred by their primary care doctor.  ° °Medication Assistance: °Organization         Address  Phone   Notes  °[place] [facility] [address] °[place], [place] [address] (336) 641-8030 --Must be a resident of [place] °-- Must have NO insurance coverage whatsoever (no Medicaid/ Medicare, etc.) °-- The pt. MUST have a primary care doctor that directs their care regularly and follows them in the community °  °[facility]  (866) 331-1348   °[employer] Way  (888) 892-1162   ° °Agencies that provide inexpensive medical care: °Organization         Address  Phone   Notes  °[facility]  (336) 832-8035   °[facility]    (336) 832-7272   °[facility] [address] °[place], [place] [address] (336) 832-4777   °[facility] [address], °[place] (336) 271-4999   °[facility]    (336) 373-0678   °[facility]    (336) 272-1050   °[facility] ° [address], [place] Phone:  (336) 832-4444, Fax:  (336) 832-4440 Hours of Operation:  9 am - 6 pm, M-F.  Also accepts Medicaid/Medicare and self-pay.  °[facility] ° [address], [place] Phone: (336) 832-3150, Fax: (336) 832-3151. Hours of Operation:  8:30 am - 5:30 pm, M-F.  Also accepts Medicaid and self-pay.  °[facility] [place] [address], [place]  Phone: (336) 878-6027   °[facility] [address], [place], [place] (336)723-1848, Ext. 123 Mondays & Thursdays: 7-9 AM.  First 15 patients are seen on a first come, first serve basis. °  ° °Medicaid-accepting [place] Providers: ° °Organization         Address  Phone   Notes  °[facility] [address], [place] (336) 641-2100 Also accepts self-pay patients.  °[facility] [address], [place] ° (336) 856-9996   °[facility] [address],  (336) 288-8857   °[facility] [address] [place] [address],  (336) 299-7000   °Veita Bland [address],   ° (336) 373-1557 Only accepts [place] Access Medicaid patients after they have their name applied to their card.  ° °Self-Pay (no insurance) in [place]: ° °Organization           Address  Phone   Notes  Sickle Cell Patients, [facility] [address], [place] [phone]   [facility] [address], [place] [phone]   [facility] [place]  [address], [place] [phone]) [phone]   [facility]/Dr. Osei-Bonsu  [address] [place] [address], [place] or [phone], [place] [phone] Phone number for both [place] and [place] locations is the same.  [facility] [address], [place] [phone]   [facility] [address] [place] [address], [place] or [address] [phone]   [facility] [address], [place] [phone], phone; [phone], fax Sees patients 1st and 3rd Saturday of every month.  Must not qualify for public or private insurance (i.e. Medicaid, Medicare, [place] Health Choice, Veterans' Benefits)  Household income should be no more than 200% of the poverty level  The clinic cannot treat you if you are pregnant or think you are pregnant  Sexually transmitted diseases are not treated at the clinic.    Dental Care: Organization         Address  Phone  Notes  [facility] [address], [place] [phone] Accepts children up to age 59 who are enrolled in [place] or [place] Health Choice; pregnant women with a Medicaid card; and children who have applied for Medicaid or [place] Health Choice, but were declined, whose parents can pay a reduced fee at time of service.  [facility] [place]  [address], [place] [phone](336) [phone] Accepts children up to age 59 who are enrolled in [place] or  Health Choice; pregnant women with a Medicaid card; and children who have applied for Medicaid or  Health Choice, but were declined, whose parents can pay a reduced fee at time of service.  [facility]  [address], [place] [phone]) [phone] Patients are seen by appointment only. Walk-ins are not accepted. [facility] will see patients 59 years of age and older. Monday - Tuesday (8am-5pm) Most Wednesdays (8:30-5pm) $30 per visit, cash only  [facility]  [address], [facility] [phone] Patients are seen by appointment only. Walk-ins are not accepted. [facility] will see patients 59 years of age and older. One Wednesday Evening (Monthly: Volunteer Based).  $30 per visit, cash only  [facility]  [phone] for adults; Children under age 294, call Graduate Pediatric Dentistry at [phone]. Children aged 704-14, please call [phone] to request a pediatric application.  Dental services are provided in all areas of dental care including fillings, crowns and bridges, complete and partial dentures, implants, gum treatment, root canals, and extractions. Preventive care is  also provided. Treatment is provided to both adults and children. Patients are selected via a lottery and there is often a waiting list.   [facility] [address], [place]  [phone] [email or web address]   [facility] [address], [place], [place] [phone], Ext. [phone] Second and Fourth Thursday of each month, opens at 6:30 AM; Clinic ends at 9 AM.  Patients are seen on a first-come first-served basis, and a limited number are seen during each clinic.   [facility]  [address], [place], [place] [phone]   Eligibility Requirements °You must have lived in [place], or [place] counties for at least the last three months. °  You cannot be eligible for state or federal sponsored healthcare insurance, including Veterans Administration, Medicaid, or Medicare. °  You generally cannot be eligible for healthcare insurance through your employer.  °  How to apply: °Eligibility screenings are held every Tuesday and Wednesday afternoon from 1:00 pm until 4:00 pm. You do not need an appointment for the interview!  °[facility] [address], [place], [place] [phone]   °[place] [facility]  336-342-8273   °[place] [facility]  336-703-3100   °[facility]  336-570-6415   ° °Behavioral Health Resources in the Community: °Intensive Outpatient Programs °Organization         Address  Phone  Notes  °[facility] [address], [place], [place] 336-878-6098   °[facility] [address], [place], [place] 336-832-9800   °[facility] [address], [place], [place] ° 336-882-2125   °[facility] [address],  °[place], [place] 1-800-853-5163 or 336-641-4981   °Substance Abuse Resources °Organization         Address  Phone  Notes  °[facility]  336-882-2125   °[facility]   336-784-9470   °[facility]  336-285-9073   °[facility]  336-845-3988   °Residential & Outpatient Substance Abuse Program  1-800-659-3381   °Psychological Services °Organization         Address  Phone  Notes  °[facility]  336- 832-9600   °[facility]  336- 378-7881   °[facility] [address], [place] 1-800-853-5163 or 336-641-4981   ° °Mobile Crisis Teams °Organization         Address  Phone  Notes  °[facility], Mobile Crisis Care Unit  1-877-626-1772   °Assertive °[facility] ° [address] [place], [place] 336-834-9664   °Sharon DeEsch [address] °[place] 336-554-5454   ° °Self-Help/Support Groups °Organization         Address  Phone             Notes  °[facility] - variety of support groups  336- 373-1402 Call for more information  °Narcotics Anonymous (NA), [facility] [address], °[place]  2 meetings at this location  ° °Residential Treatment Programs °Organization         Address  Phone  Notes  °[facility] [address],    °[place]  1-866-801-8205   °[facility] ° [address], [place], [place] 704-293-8524   °[facility] [address], [place] 336-845-3988 Admissions: 8am-3pm M-F  °[facility] [address]    °[place], [place] 336-841-1104   °[facility] [address], [place], [place] 336-379-7146   °[facility] [address]  °[place], [place] 336-285-9073   °[facility] Intensive Outpatient [address], [place], [place] 336-852-3033   °[facility] [address]  °[place], [place] 1-877-615-2722 or 336-784-9470   °[facility] ([facility]) [address] [place], [place] 336-227-7417 Accepts Medicaid  °[facility] [address]  °[place]  1-800-659-3381 Substance  Abuse/Addiction Treatment  ° °[place] Behavioral Health Resources °Organization           Address  Phone  Notes  °[facility]  (888) 581-9988   °Julie Brannon, PhD [address] [place], [place]   (336) 349-5553 or (336) 951-0000   °[facility]   [address] °[place], [place] (336) 349-4454   °[facility] [address], [place], [place] (336) 342-8316 Insurance/Medicaid/sponsorship through [facility]  °[facility] [address]                                    [place], [place] (336) 342-8316 Therapy/tele-psych/case  °[facility] [address]  ° [place], [place] (336) 349-2233    °Dr. Arfeen  (336) 349-4544   °[facility] [place]  [employer] [place] [facility] 1) [address], [place] °2) [address], [place] °3)  [address], [place] (336) 349-3220 °(336) 342-7768 ° °(336) 342-8140   °[place] Child Abuse Hotline (336) 342-1394 or (336) 342-3537 (After Hours)    ° ° ° °

## 2015-09-04 NOTE — ED Notes (Signed)
Pt declined wheelchair.

## 2015-09-04 NOTE — ED Notes (Signed)
Pt tripped over her feet this morning and fell on both knees on concrete. Pt has mild swelling in bilateral knee area. Pt is still able to ambulate

## 2017-01-14 DIAGNOSIS — I10 Essential (primary) hypertension: Secondary | ICD-10-CM | POA: Diagnosis not present

## 2017-01-14 DIAGNOSIS — E785 Hyperlipidemia, unspecified: Secondary | ICD-10-CM | POA: Diagnosis not present

## 2017-01-14 DIAGNOSIS — Z1159 Encounter for screening for other viral diseases: Secondary | ICD-10-CM | POA: Diagnosis not present

## 2017-02-21 DIAGNOSIS — H109 Unspecified conjunctivitis: Secondary | ICD-10-CM | POA: Diagnosis not present

## 2017-08-18 DIAGNOSIS — I10 Essential (primary) hypertension: Secondary | ICD-10-CM | POA: Diagnosis not present

## 2017-08-18 DIAGNOSIS — E785 Hyperlipidemia, unspecified: Secondary | ICD-10-CM | POA: Diagnosis not present

## 2017-08-24 ENCOUNTER — Encounter (HOSPITAL_COMMUNITY): Payer: Self-pay

## 2017-08-24 ENCOUNTER — Emergency Department (HOSPITAL_COMMUNITY)
Admission: EM | Admit: 2017-08-24 | Discharge: 2017-08-24 | Disposition: A | Discharge status: Home / Self Care | Payer: 59 | Attending: Emergency Medicine | Admitting: Emergency Medicine

## 2017-08-24 ENCOUNTER — Other Ambulatory Visit: Payer: Self-pay

## 2017-08-24 DIAGNOSIS — M79602 Pain in left arm: Secondary | ICD-10-CM | POA: Insufficient documentation

## 2017-08-24 DIAGNOSIS — Z5321 Procedure and treatment not carried out due to patient leaving prior to being seen by health care provider: Secondary | ICD-10-CM | POA: Insufficient documentation

## 2017-08-24 DIAGNOSIS — R55 Syncope and collapse: Secondary | ICD-10-CM | POA: Diagnosis not present

## 2017-08-24 LAB — URINALYSIS, ROUTINE W REFLEX MICROSCOPIC
Bilirubin Urine: NEGATIVE
Glucose, UA: NEGATIVE mg/dL
Ketones, ur: NEGATIVE mg/dL
Nitrite: NEGATIVE
PH: 5 (ref 5.0–8.0)
Protein, ur: NEGATIVE mg/dL
SPECIFIC GRAVITY, URINE: 1.008 (ref 1.005–1.030)

## 2017-08-24 LAB — CBC
HCT: 39.4 % (ref 36.0–46.0)
Hemoglobin: 13.6 g/dL (ref 12.0–15.0)
MCH: 31.4 pg (ref 26.0–34.0)
MCHC: 34.5 g/dL (ref 30.0–36.0)
MCV: 91 fL (ref 78.0–100.0)
PLATELETS: 259 10*3/uL (ref 150–400)
RBC: 4.33 MIL/uL (ref 3.87–5.11)
RDW: 13.8 % (ref 11.5–15.5)
WBC: 8.1 10*3/uL (ref 4.0–10.5)

## 2017-08-24 LAB — BASIC METABOLIC PANEL
Anion gap: 10 (ref 5–15)
BUN: 18 mg/dL (ref 6–20)
CALCIUM: 10 mg/dL (ref 8.9–10.3)
CO2: 23 mmol/L (ref 22–32)
CREATININE: 0.76 mg/dL (ref 0.44–1.00)
Chloride: 103 mmol/L (ref 101–111)
GFR calc non Af Amer: >60 mL/min (ref >60)
Glucose, Bld: 109 mg/dL — ABNORMAL HIGH (ref 65–99)
Potassium: 3.7 mmol/L (ref 3.5–5.1)
Sodium: 136 mmol/L (ref 135–145)

## 2017-08-24 LAB — I-STAT BETA HCG BLOOD, ED (MC, WL, AP ONLY): I-stat hCG, quantitative: <5 m[IU]/mL (ref <5)

## 2017-08-24 LAB — I-STAT TROPONIN, ED: TROPONIN I, POC: 0 ng/mL (ref 0.00–0.08)
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
## 2017-08-29 DIAGNOSIS — M79602 Pain in left arm: Secondary | ICD-10-CM | POA: Diagnosis not present

## 2017-12-29 ENCOUNTER — Ambulatory Visit (INDEPENDENT_AMBULATORY_CARE_PROVIDER_SITE_OTHER)

## 2017-12-29 ENCOUNTER — Encounter: Payer: Self-pay | Admitting: Podiatry

## 2017-12-29 ENCOUNTER — Ambulatory Visit (INDEPENDENT_AMBULATORY_CARE_PROVIDER_SITE_OTHER): Payer: 59 | Admitting: Podiatry

## 2017-12-29 DIAGNOSIS — M21961 Unspecified acquired deformity of right lower leg: Secondary | ICD-10-CM

## 2017-12-29 DIAGNOSIS — M779 Enthesopathy, unspecified: Secondary | ICD-10-CM | POA: Diagnosis not present

## 2017-12-29 DIAGNOSIS — M2011 Hallux valgus (acquired), right foot: Secondary | ICD-10-CM | POA: Diagnosis not present

## 2017-12-29 DIAGNOSIS — M21611 Bunion of right foot: Secondary | ICD-10-CM

## 2017-12-29 DIAGNOSIS — M778 Other enthesopathies, not elsewhere classified: Secondary | ICD-10-CM

## 2017-12-29 DIAGNOSIS — M7750 Other enthesopathy of unspecified foot: Secondary | ICD-10-CM | POA: Diagnosis not present

## 2017-12-29 NOTE — Patient Instructions (Signed)
Pre-Operative Instructions  Congratulations, you have decided to take an important step towards improving your quality of life.  You can be assured that the doctors and staff at [facility] will be with you every step of the way.  Here are some important things you should know:  1. Plan to be at the surgery center/hospital at least 1 (one) hour prior to your scheduled time, unless otherwise directed by the surgical center/hospital staff.  You must have a responsible adult accompany you, remain during the surgery and drive you home.  Make sure you have directions to the surgical center/hospital to ensure you arrive on time. 2. If you are having [facility] at [facility], you will need a copy of your medical history and physical form from your family physician within one month prior to the date of surgery. We will give you a form for your primary physician to complete.  3. We make every effort to accommodate the date you request for surgery.  However, there are times where surgery dates or times have to be moved.  We will contact you as soon as possible if a change in schedule is required.   4. No aspirin/ibuprofen for one week before surgery.  If you are on aspirin, any non-steroidal anti-inflammatory medications (Mobic, Aleve, Ibuprofen) should not be taken seven (7) days prior to your surgery.  You make take Tylenol for pain prior to surgery.  5. Medications - If you are taking daily heart and blood pressure medications, seizure, reflux, allergy, asthma, anxiety, pain or diabetes medications, make sure you notify the surgery center/hospital before the day of surgery so they can tell you which medications you should take or avoid the day of surgery. 6. No food or drink after midnight the night before surgery unless directed otherwise by surgical center/hospital staff. 7. No alcoholic beverages 24-hours prior to surgery.  No smoking 24-hours prior or 24-hours after  surgery. 8. Wear loose pants or shorts. They should be loose enough to fit over bandages, boots, and casts. 9. Don't wear slip-on shoes. Sneakers are preferred. 10. Bring your boot with you to the surgery center/hospital.  Also bring crutches or a walker if your physician has prescribed it for you.  If you do not have this equipment, it will be provided for you after surgery. 11. If you have not been contacted by the surgery center/hospital by the day before your surgery, call to confirm the date and time of your surgery. 12. Leave-time from work may vary depending on the type of surgery you have.  Appropriate arrangements should be made prior to surgery with your employer. 13. Prescriptions will be provided immediately following surgery by your doctor.  Fill these as soon as possible after surgery and take the medication as directed. Pain medications will not be refilled on weekends and must be approved by the doctor. 14. Remove nail polish on the operative foot and avoid getting pedicures prior to surgery. 15. Wash the night before surgery.  The night before surgery wash the foot and leg well with water and the antibacterial soap provided. Be sure to pay special attention to beneath the toenails and in between the toes.  Wash for at least three (3) minutes. Rinse thoroughly with water and dry well with a towel.  Perform this wash unless told not to do so by your physician.  Enclosed: 1 Ice pack (please put in freezer the night before surgery)   1 Hibiclens skin cleaner     Pre-op instructions  If you have any questions regarding the instructions, please do not hesitate to call our office.  [place]: [address], [place], [place] [address] -- [phone]  [place]: [address] [place], [place] [address] -- [phone]  [place]: [address]  [place], [place] [address] -- [phone]  [place]: [address], [place],  [address] -- [phone]  Website:  [email or web address] 

## 2018-01-05 ENCOUNTER — Telehealth: Payer: Self-pay | Admitting: *Deleted

## 2018-01-05 NOTE — Telephone Encounter (Signed)
I attempted to call the patient at all numbers to see if she wanted to schedule surgery.  Her mailboxes were full.  I couldn't leave any messages.  I will try again later.

## 2018-01-18 NOTE — Telephone Encounter (Signed)
"  I am calling to schedule my surgery.  I can't remember the doctor's name."  You saw Dr. Samuella CotaPrice.  He does surgeries on Wednesdays.  Do you have a date in mind?  "Yes, I'd like to do it August 21.  I do a lot of international traveling so my schedule is hectic."  I will get it scheduled.  You can go on-line and register, instructions are in the brochure that we gave you in your gray bag.

## 2018-02-02 ENCOUNTER — Telehealth: Payer: Self-pay | Admitting: *Deleted

## 2018-02-02 NOTE — Telephone Encounter (Signed)
"  I am supposed to be scheduled for surgery on August 21.  I went on-line and registered with the surgery center but I haven't heard from them.  I need to know if I'm scheduled for this date because I have several people coming in from other countries.  I have a tight schedule."  You are scheduled for surgery that date.  Someone from the surgical center will only call you a day or two prior to your surgery date.  They will give you your arrival time and may ask other questions. "Okay, thank you.  I can go ahead and make my plans now."

## 2018-03-05 NOTE — Progress Notes (Signed)
  Subjective:  Patient ID: Shelia Sanford, female    DOB: 10-Mar-1956,  MRN: [id number]  Chief Complaint  Patient presents with  . Foot Pain    right foot - bunion surgery 8+ years ago - she thinks the hardware could be  causing her problem   62 y.o. female presents with the above complaint.  Has history of right foot bunion surgery 8 years ago thinks the hardware is causing her pain. Past Medical History:  Diagnosis Date  . High cholesterol   . Hypertension    Past Surgical History:  Procedure Laterality Date  . FOOT SURGERY     right foot, 2010    Current Outpatient Medications:  .  ALPRAZolam (XANAX) 1 MG tablet, Take 0.5-1 mg by mouth 3 (three) times daily as needed for anxiety. , Disp: , Rfl: 0 .  amLODipine (NORVASC) 5 MG tablet, Take 5 mg by mouth daily., Disp: , Rfl: 3 .  aspirin 325 MG tablet, Take 325 mg by mouth daily., Disp: , Rfl:  .  atorvastatin (LIPITOR) 20 MG tablet, Take 20 mg by mouth daily., Disp: , Rfl: 3 .  ibuprofen (ADVIL,MOTRIN) 800 MG tablet, Take 1 tablet (800 mg total) by mouth 3 (three) times daily., Disp: 21 tablet, Rfl: 0 .  lisinopril-hydrochlorothiazide (PRINZIDE,ZESTORETIC) 20-25 MG per tablet, Take 1 tablet by mouth daily., Disp: , Rfl: 3 .  nabumetone (RELAFEN) 500 MG tablet, Take 500 mg by mouth 2 (two) times daily., Disp: , Rfl: 3  Allergies  Allergen Reactions  . Sulfa Antibiotics Swelling   Review of Systems: Negative except as noted in the HPI. Denies N/V/F/Ch. Objective:  There were no vitals filed for this visit. General AA&O x3. Normal mood and affect.  Vascular Dorsalis pedis and posterior tibial pulses  present 2+ bilaterally  Capillary refill normal to all digits. Pedal hair growth normal.  Neurologic Epicritic sensation grossly present.  Dermatologic No open lesions. Interspaces clear of maceration. Nails well groomed and normal in appearance.  Orthopedic: MMT 5/5 in dorsiflexion, plantarflexion, inversion, and eversion. Normal  joint ROM without pain or crepitus. HAV deformity right pain to palpation prominent retained hardware   Assessment & Plan:  Patient was evaluated and treated and all questions answered.  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  [facility] replacement of the 1st MPJ versus Austin bunionectomy. Possible weil osteotomy 2nd metatarsal.  Return for post op.

## 2018-03-15 DIAGNOSIS — E785 Hyperlipidemia, unspecified: Secondary | ICD-10-CM | POA: Diagnosis not present

## 2018-03-15 DIAGNOSIS — Z1211 Encounter for screening for malignant neoplasm of colon: Secondary | ICD-10-CM | POA: Diagnosis not present

## 2018-03-15 DIAGNOSIS — I10 Essential (primary) hypertension: Secondary | ICD-10-CM | POA: Diagnosis not present

## 2018-03-15 DIAGNOSIS — R7301 Impaired fasting glucose: Secondary | ICD-10-CM | POA: Diagnosis not present

## 2018-04-05 ENCOUNTER — Other Ambulatory Visit: Payer: Self-pay | Admitting: Podiatry

## 2018-04-05 ENCOUNTER — Encounter: Payer: Self-pay | Admitting: Podiatry

## 2018-04-05 DIAGNOSIS — T8484XA Pain due to internal orthopedic prosthetic devices, implants and grafts, initial encounter: Secondary | ICD-10-CM | POA: Diagnosis not present

## 2018-04-05 DIAGNOSIS — M21961 Unspecified acquired deformity of right lower leg: Secondary | ICD-10-CM

## 2018-04-05 DIAGNOSIS — M216X1 Other acquired deformities of right foot: Secondary | ICD-10-CM | POA: Diagnosis not present

## 2018-04-05 DIAGNOSIS — M779 Enthesopathy, unspecified: Secondary | ICD-10-CM | POA: Diagnosis not present

## 2018-04-05 DIAGNOSIS — M2011 Hallux valgus (acquired), right foot: Secondary | ICD-10-CM | POA: Diagnosis not present

## 2018-04-05 DIAGNOSIS — M2041 Other hammer toe(s) (acquired), right foot: Secondary | ICD-10-CM

## 2018-04-05 DIAGNOSIS — M21611 Bunion of right foot: Secondary | ICD-10-CM

## 2018-04-05 DIAGNOSIS — Z472 Encounter for removal of internal fixation device: Secondary | ICD-10-CM | POA: Diagnosis not present

## 2018-04-05 DIAGNOSIS — M6240 Contracture of muscle, unspecified site: Secondary | ICD-10-CM | POA: Diagnosis not present

## 2018-04-05 DIAGNOSIS — Z969 Presence of functional implant, unspecified: Secondary | ICD-10-CM | POA: Diagnosis not present

## 2018-04-05 DIAGNOSIS — M25571 Pain in right ankle and joints of right foot: Secondary | ICD-10-CM | POA: Diagnosis not present

## 2018-04-05 MED ORDER — OXYCODONE-ACETAMINOPHEN 10-325 MG PO TABS: 1.0000 | ORAL_TABLET | ORAL | 0 refills | Status: DC | PRN

## 2018-04-05 MED ORDER — CEPHALEXIN 500 MG PO CAPS: 500.0000 mg | ORAL_CAPSULE | Freq: Three times a day (TID) | ORAL | 0 refills | Status: AC

## 2018-04-05 MED ORDER — PROMETHAZINE HCL 25 MG PO TABS: 25.0000 mg | ORAL_TABLET | Freq: Three times a day (TID) | ORAL | 0 refills | Status: AC | PRN

## 2018-04-05 NOTE — Progress Notes (Signed)
Rx sent to pharmacy for outpatient surgery. °

## 2018-04-05 NOTE — Progress Notes (Signed)
[facility]  Procedures:  Correction R Foot Bunion with Metatarsal Osteotomy  Removal of Hardware Deep  Extensor Tendon Lengthening  Weil Osteotomy 2nd Metatarsal  Right 2nd Hammertoe Repair

## 2018-04-07 ENCOUNTER — Ambulatory Visit (INDEPENDENT_AMBULATORY_CARE_PROVIDER_SITE_OTHER): Admitting: Podiatry

## 2018-04-07 ENCOUNTER — Ambulatory Visit (INDEPENDENT_AMBULATORY_CARE_PROVIDER_SITE_OTHER): Payer: 59

## 2018-04-07 VITALS — BP 106/61 | HR 74 | Temp 96.6°F

## 2018-04-07 DIAGNOSIS — M21611 Bunion of right foot: Secondary | ICD-10-CM

## 2018-04-07 DIAGNOSIS — M2041 Other hammer toe(s) (acquired), right foot: Secondary | ICD-10-CM

## 2018-04-07 DIAGNOSIS — M2011 Hallux valgus (acquired), right foot: Secondary | ICD-10-CM

## 2018-04-07 MED ORDER — OXYCODONE-ACETAMINOPHEN 10-325 MG PO TABS: 1.0000 | ORAL_TABLET | ORAL | 0 refills | Status: AC | PRN

## 2018-04-07 NOTE — Progress Notes (Signed)
  Subjective:  Patient ID: Shelia Sanford, female    DOB: 1956-06-19,  MRN: [id number]  Chief Complaint  Patient presents with  . Routine Post Op     dos 08.21.2019 Austin Bunionectomy Rt VS Keller Bunion Implant Rt, Metatarsal Osteotomy 2nd Rt, Removal Fixation Deep Kwire/Screw Rt, Hammertoe Repair 2nd Rt " my foot feels ok"    DOS: 04/05/18 Procedure: Austion  62 y.o. female returns for post-op check. Doing well. Taking pain medication as scheduled. Still having some residual effects of the nerve block.  Review of Systems: Negative except as noted in the HPI. Denies N/V/F/Ch.  Past Medical History:  Diagnosis Date  . High cholesterol   . Hypertension     Current Outpatient Medications:  .  ALPRAZolam (XANAX) 1 MG tablet, Take 0.5-1 mg by mouth 3 (three) times daily as needed for anxiety. , Disp: , Rfl: 0 .  amLODipine (NORVASC) 5 MG tablet, Take 5 mg by mouth daily., Disp: , Rfl: 3 .  aspirin 325 MG tablet, Take 325 mg by mouth daily., Disp: , Rfl:  .  atorvastatin (LIPITOR) 20 MG tablet, Take 20 mg by mouth daily., Disp: , Rfl: 3 .  cephALEXin (KEFLEX) 500 MG capsule, Take 1 capsule (500 mg total) by mouth 3 (three) times daily., Disp: 21 capsule, Rfl: 0 .  ibuprofen (ADVIL,MOTRIN) 800 MG tablet, Take 1 tablet (800 mg total) by mouth 3 (three) times daily., Disp: 21 tablet, Rfl: 0 .  lisinopril-hydrochlorothiazide (PRINZIDE,ZESTORETIC) 20-25 MG per tablet, Take 1 tablet by mouth daily., Disp: , Rfl: 3 .  nabumetone (RELAFEN) 500 MG tablet, Take 500 mg by mouth 2 (two) times daily., Disp: , Rfl: 3 .  oxyCODONE-acetaminophen (PERCOCET) 10-325 MG tablet, Take 1 tablet by mouth every 4 (four) hours as needed for pain., Disp: 20 tablet, Rfl: 0 .  promethazine (PHENERGAN) 25 MG tablet, Take 1 tablet (25 mg total) by mouth every 8 (eight) hours as needed for nausea or vomiting., Disp: 20 tablet, Rfl: 0  Social History   Tobacco Use  Smoking Status Never Smoker    Allergies  Allergen  Reactions  . Sulfa Antibiotics Swelling   Objective:   Vitals:   04/07/18 1326  BP: 106/61  Pulse: 74  Temp: (!) 96.6 F (35.9 C)   There is no height or weight on file to calculate BMI. Constitutional Well developed. Well nourished.  Vascular Foot warm and well perfused. Capillary refill normal to all digits.   Neurologic Normal speech. Oriented to person, place, and time. Epicritic sensation to light touch grossly present bilaterally.  Dermatologic Skin healing well without signs of infection. Skin edges well coapted without signs of infection.  Orthopedic: Tenderness to palpation noted about the surgical site.   Radiographs: Taken and reviewed. Consistent with post-op state. Assessment:   1. Hallux valgus with bunions of right foot   2. Hammer toe of right foot    Plan:  Patient was evaluated and treated and all questions answered.  S/p foot surgery right -Progressing as expected post-operatively. -XR: As above -WB Status: WBAT in CAM Boot -Sutures: intact. -Medications: Refill  -Foot redressed.  Return in about 1 week (around 04/14/2018) for keep current post op appt, price patient.

## 2018-04-11 NOTE — Progress Notes (Signed)
DOS 04/05/2018  Correction of right foot bunion with joint replacement and implant right. Metatarsal osteotomy with removal of pin first metatarsal right. Correction right second hammertoe with pin fixation and second metatarsal shortening osteotomy.

## 2018-04-12 DIAGNOSIS — M216X1 Other acquired deformities of right foot: Secondary | ICD-10-CM | POA: Diagnosis not present

## 2018-04-14 ENCOUNTER — Encounter

## 2018-04-14 ENCOUNTER — Ambulatory Visit (INDEPENDENT_AMBULATORY_CARE_PROVIDER_SITE_OTHER): Payer: Self-pay | Admitting: Podiatry

## 2018-04-14 DIAGNOSIS — M2011 Hallux valgus (acquired), right foot: Secondary | ICD-10-CM

## 2018-04-14 DIAGNOSIS — M21611 Bunion of right foot: Secondary | ICD-10-CM

## 2018-04-14 DIAGNOSIS — M2041 Other hammer toe(s) (acquired), right foot: Secondary | ICD-10-CM

## 2018-04-14 MED ORDER — HYDROCODONE-ACETAMINOPHEN 5-325 MG PO TABS: 1.0000 | ORAL_TABLET | Freq: Four times a day (QID) | ORAL | 0 refills | Status: DC | PRN

## 2018-04-14 NOTE — Progress Notes (Signed)
Subjective:   Patient ID: Shelia Sanford, female   DOB: 62 y.o.   MRN: [id number]   HPI Patient presents stating that overall she is doing well with minimal discomfort   ROS      Objective:  Physical Exam  Neurovascular status intact with patient's right foot healing well wound edges well coapted everything in good alignment pin in place and negative Homans sign noted     Assessment:  Doing well post surgery of 9 days     Plan:  Reapplied sterile dressing advised to continue elevation compression immobilization and reappoint routine visit in 1 week or earlier if needed

## 2018-04-21 ENCOUNTER — Encounter: Payer: Self-pay | Admitting: Podiatry

## 2018-04-21 ENCOUNTER — Ambulatory Visit (INDEPENDENT_AMBULATORY_CARE_PROVIDER_SITE_OTHER): Admitting: Podiatry

## 2018-04-21 DIAGNOSIS — Z9889 Other specified postprocedural states: Secondary | ICD-10-CM

## 2018-04-21 DIAGNOSIS — M21611 Bunion of right foot: Secondary | ICD-10-CM

## 2018-04-21 DIAGNOSIS — M2011 Hallux valgus (acquired), right foot: Secondary | ICD-10-CM

## 2018-04-21 MED ORDER — HYDROCODONE-ACETAMINOPHEN 5-325 MG PO TABS: 1.0000 | ORAL_TABLET | Freq: Four times a day (QID) | ORAL | 0 refills | Status: DC | PRN

## 2018-04-21 NOTE — Progress Notes (Signed)
  Subjective:  Patient ID: Shelia Sanford, female    DOB: January 20, 1956,  MRN: [id number]  Chief Complaint  Patient presents with  . Routine Post Op    dos 08.21.2019 Austin Bunionectomy Rt VS Keller Bunion Implant Rt, Metatarsal Osteotomy 2nd Rt, Removal Fixation Deep Kwire/Screw Rt, Hammertoe Repair 2nd Rt; pt stated, "still having constant burning"   DOS: 04/05/18 Procedure: Rt Austin bunionectomy, metatarsal osteotomy, removal of deep fixation, hammertoe repair 2nd  62 y.o. female returns for post-op check. Doing well. States the toe is burning but otherwise doing well.   Review of Systems: Negative except as noted in the HPI. Denies N/V/F/Ch.  Past Medical History:  Diagnosis Date  . High cholesterol   . Hypertension     Current Outpatient Medications:  .  ALPRAZolam (XANAX) 1 MG tablet, Take 0.5-1 mg by mouth 3 (three) times daily as needed for anxiety. , Disp: , Rfl: 0 .  amLODipine (NORVASC) 5 MG tablet, Take 5 mg by mouth daily., Disp: , Rfl: 3 .  aspirin 325 MG tablet, Take 325 mg by mouth daily., Disp: , Rfl:  .  atorvastatin (LIPITOR) 20 MG tablet, Take 20 mg by mouth daily., Disp: , Rfl: 3 .  cephALEXin (KEFLEX) 500 MG capsule, Take 1 capsule (500 mg total) by mouth 3 (three) times daily., Disp: 21 capsule, Rfl: 0 .  HYDROcodone-acetaminophen (NORCO/VICODIN) 5-325 MG tablet, Take 1 tablet by mouth every 6 (six) hours as needed for moderate pain., Disp: 30 tablet, Rfl: 0 .  ibuprofen (ADVIL,MOTRIN) 800 MG tablet, Take 1 tablet (800 mg total) by mouth 3 (three) times daily., Disp: 21 tablet, Rfl: 0 .  lisinopril-hydrochlorothiazide (PRINZIDE,ZESTORETIC) 20-25 MG per tablet, Take 1 tablet by mouth daily., Disp: , Rfl: 3 .  nabumetone (RELAFEN) 500 MG tablet, Take 500 mg by mouth 2 (two) times daily., Disp: , Rfl: 3 .  oxyCODONE-acetaminophen (PERCOCET) 10-325 MG tablet, Take 1 tablet by mouth every 4 (four) hours as needed for pain., Disp: 20 tablet, Rfl: 0 .  promethazine  (PHENERGAN) 25 MG tablet, Take 1 tablet (25 mg total) by mouth every 8 (eight) hours as needed for nausea or vomiting., Disp: 20 tablet, Rfl: 0  Social History   Tobacco Use  Smoking Status Never Smoker  Smokeless Tobacco Never Used    Allergies  Allergen Reactions  . Sulfa Antibiotics Swelling   Objective:   There were no vitals filed for this visit. There is no height or weight on file to calculate BMI. Constitutional Well developed. Well nourished.  Vascular Foot warm and well perfused. Capillary refill normal to all digits.   Neurologic Normal speech. Oriented to person, place, and time. Epicritic sensation to light touch grossly present bilaterally.  Dermatologic Skin healing well without signs of infection. Skin edges well coapted without signs of infection.  Orthopedic: Tenderness to palpation noted about the surgical site.   Radiographs: Taken and reviewed. Consistent with post-op state. Assessment:   1. Hallux valgus with bunions of right foot   2. Post-operative state    Plan:  Patient was evaluated and treated and all questions answered.  S/p foot surgery right -Progressing as expected post-operatively. -XR: As above -WB Status: WBAT in CAM Boot -Sutures: every other suture removed. -Medications: Refill  -Foot redressed. -Plan for XR, pin removal, remaining suture removal in 2 weeks.  Return in about 2 weeks (around 05/05/2018) for Post-op, Right with XR.Marland Kitchen

## 2018-05-05 ENCOUNTER — Ambulatory Visit (INDEPENDENT_AMBULATORY_CARE_PROVIDER_SITE_OTHER): Payer: 59

## 2018-05-05 ENCOUNTER — Ambulatory Visit (INDEPENDENT_AMBULATORY_CARE_PROVIDER_SITE_OTHER): Admitting: Podiatry

## 2018-05-05 DIAGNOSIS — M21611 Bunion of right foot: Secondary | ICD-10-CM

## 2018-05-05 DIAGNOSIS — M2011 Hallux valgus (acquired), right foot: Secondary | ICD-10-CM

## 2018-05-05 NOTE — Progress Notes (Signed)
Subjective:  Patient ID: Shelia Sanford, female    DOB: 10/11/1955,  MRN: [id number]  Chief Complaint  Patient presents with  . Routine Post Op    )2 Week Follow Up - dos 08.21.2019 Austin Bunionectomy Rt VS Keller Bunion Implant Rt, Metatarsal Osteotomy 2nd Rt, Removal Fixation Deep Kwire/Screw Rt, Hammertoe Repair 2nd R   DOS: 04/05/18 Procedure: Rt Austin bunionectomy, metatarsal osteotomy, removal of deep fixation, hammertoe repair 2nd  62 y.o. female returns for post-op check.  Doing well.  Ready to get her sutures out so she can shower.  Review of Systems: Negative except as noted in the HPI. Denies N/V/F/Ch.  Past Medical History:  Diagnosis Date  . High cholesterol   . Hypertension     Current Outpatient Medications:  .  ALPRAZolam (XANAX) 1 MG tablet, Take 0.5-1 mg by mouth 3 (three) times daily as needed for anxiety. , Disp: , Rfl: 0 .  amLODipine (NORVASC) 5 MG tablet, Take 5 mg by mouth daily., Disp: , Rfl: 3 .  aspirin 325 MG tablet, Take 325 mg by mouth daily., Disp: , Rfl:  .  atorvastatin (LIPITOR) 20 MG tablet, Take 20 mg by mouth daily., Disp: , Rfl: 3 .  cephALEXin (KEFLEX) 500 MG capsule, Take 1 capsule (500 mg total) by mouth 3 (three) times daily., Disp: 21 capsule, Rfl: 0 .  HYDROcodone-acetaminophen (NORCO/VICODIN) 5-325 MG tablet, Take 1 tablet by mouth every 6 (six) hours as needed for moderate pain., Disp: 30 tablet, Rfl: 0 .  ibuprofen (ADVIL,MOTRIN) 800 MG tablet, Take 1 tablet (800 mg total) by mouth 3 (three) times daily., Disp: 21 tablet, Rfl: 0 .  lisinopril-hydrochlorothiazide (PRINZIDE,ZESTORETIC) 20-25 MG per tablet, Take 1 tablet by mouth daily., Disp: , Rfl: 3 .  nabumetone (RELAFEN) 500 MG tablet, Take 500 mg by mouth 2 (two) times daily., Disp: , Rfl: 3 .  oxyCODONE-acetaminophen (PERCOCET) 10-325 MG tablet, Take 1 tablet by mouth every 4 (four) hours as needed for pain., Disp: 20 tablet, Rfl: 0 .  promethazine (PHENERGAN) 25 MG tablet, Take 1  tablet (25 mg total) by mouth every 8 (eight) hours as needed for nausea or vomiting., Disp: 20 tablet, Rfl: 0  Social History   Tobacco Use  Smoking Status Never Smoker  Smokeless Tobacco Never Used    Allergies  Allergen Reactions  . Sulfa Antibiotics Swelling   Objective:   There were no vitals filed for this visit. There is no height or weight on file to calculate BMI. Constitutional Well developed. Well nourished.  Vascular Foot warm and well perfused. Capillary refill normal to all digits.   Neurologic Normal speech. Oriented to person, place, and time. Epicritic sensation to light touch grossly present bilaterally.  Dermatologic Skin healing well without signs of infection. Skin edges well coapted without signs of infection.  Orthopedic: Tenderness to palpation noted about the surgical site. Second toe rectus with pin fixation   Radiographs: Taken and reviewed. Consistent with post-op state.  Evidence of bridging of the second toe. Assessment:   1. Hallux valgus with bunions of right foot    Plan:  Patient was evaluated and treated and all questions answered.  S/p foot surgery right -Progressing as expected post-operatively. -XR: As above -WB Status: WBAT in CAM Boot -Sutures: remaining sutures removed.; steris applied. -Pin pulled second toe -Digital alignment splint dispensed.  Hallux and second toe splinted. -Medications: none refilled today  Return in about 2 weeks (around 05/19/2018) for Post-op.  Needs x-rays at that  visit

## 2018-05-08 ENCOUNTER — Telehealth: Payer: Self-pay | Admitting: Podiatry

## 2018-05-08 NOTE — Telephone Encounter (Signed)
I'm a pt of Dr. Kandice HamsPrice's and I was in on Friday and had some pins removed from my foot. My foot is still very painful and swollen. I was wondering if that is normal? I'm planning a trip to [place] tomorrow and I will be driving for about four hours. I need to know if that's recommended or do I need to rethink this? My number is [phone]. Thank you.

## 2018-05-08 NOTE — Telephone Encounter (Signed)
It's probably normal, could also be from transitioning from the boot to the shoe. She can go back into the boot if it's more comfortable. I think she'll be fine on her trip just make sure she's elevating it when she gets to her destination to help with the swelling.

## 2018-05-08 NOTE — Telephone Encounter (Signed)
I informed pt of Dr. Kandice HamsPrice's 05/08/2018 11:44am recommendations and pt states understanding. Pt states she may not go on her trip, she didn't keep the boot and will be in the surgical shoe. I told pt she would then need to rest, ice and elevate during periods of discomfort and swelling.

## 2018-05-08 NOTE — Telephone Encounter (Signed)
I asked pt if she had more swellng, redness, pain or drainage than before the pin removal. Pt stated no, it was about the same without drainage. I told pt that although the pin had been removed, it was still a surgery foot and she may have discomfort to varying degrees due to her increase activity and swelling on and off to varying degrees for 6-9 months, to remain in the surgery shoe, which would decrease the movement in the area, which may also decrease the pain and swelling, I told her, let her foot be her guide as to her activity, rest, ice and elevate for periods of discomfort and swelling. I asked pt if she had a compression sock and she stated she was using the ace, and is still in a surgical dressing. Pt states she is wearing a black brace that also goes around the toes,over the ace at night, and is waking at night with pain. I told pt I would inform Dr. Samuella CotaPrice and call again.

## 2018-05-19 ENCOUNTER — Ambulatory Visit (INDEPENDENT_AMBULATORY_CARE_PROVIDER_SITE_OTHER): Admitting: Podiatry

## 2018-05-19 ENCOUNTER — Encounter: Payer: Self-pay | Admitting: Podiatry

## 2018-05-19 ENCOUNTER — Ambulatory Visit (INDEPENDENT_AMBULATORY_CARE_PROVIDER_SITE_OTHER): Payer: 59

## 2018-05-19 DIAGNOSIS — M21611 Bunion of right foot: Secondary | ICD-10-CM

## 2018-05-19 DIAGNOSIS — M2011 Hallux valgus (acquired), right foot: Secondary | ICD-10-CM

## 2018-05-19 MED ORDER — HYDROCODONE-ACETAMINOPHEN 5-325 MG PO TABS: 1.0000 | ORAL_TABLET | Freq: Four times a day (QID) | ORAL | 0 refills | Status: AC | PRN

## 2018-05-19 MED ORDER — HYDROCODONE-ACETAMINOPHEN 5-325 MG PO TABS: 1.0000 | ORAL_TABLET | Freq: Four times a day (QID) | ORAL | 0 refills | Status: DC | PRN

## 2018-05-19 NOTE — Progress Notes (Signed)
  Subjective:  Patient ID: Shelia Sanford, female    DOB: 1956-02-19,  MRN: [id number]  Chief Complaint  Patient presents with  . Routine Post Op    )dos 08.21.2019 Austin Bunionectomy Rt VS Keller Bunion Implant Rt, Metatarsal Osteotomy 2nd Rt, Removal Fixation Deep Kwire/Screw Rt, Hammertoe Repair 2nd Rt -Dr. Samuella Sanford   DOS: 04/05/18 Procedure: Shelia Sanford bunionectomy, metatarsal osteotomy, removal of deep fixation, hammertoe repair 2nd  62 y.o. female returns for post-op check.  Doing great overall still states that her great toe and second toe are sensitive.  Review of Systems: Negative except as noted in the HPI. Denies N/V/F/Ch.  Past Medical History:  Diagnosis Date  . High cholesterol   . Hypertension     Current Outpatient Medications:  .  ALPRAZolam (XANAX) 1 MG tablet, Take 0.5-1 mg by mouth 3 (three) times daily as needed for anxiety. , Disp: , Rfl: 0 .  amLODipine (NORVASC) 5 MG tablet, Take 5 mg by mouth daily., Disp: , Rfl: 3 .  aspirin 325 MG tablet, Take 325 mg by mouth daily., Disp: , Rfl:  .  atorvastatin (LIPITOR) 20 MG tablet, Take 20 mg by mouth daily., Disp: , Rfl: 3 .  cephALEXin (KEFLEX) 500 MG capsule, Take 1 capsule (500 mg total) by mouth 3 (three) times daily., Disp: 21 capsule, Rfl: 0 .  HYDROcodone-acetaminophen (NORCO/VICODIN) 5-325 MG tablet, Take 1 tablet by mouth every 6 (six) hours as needed for moderate pain., Disp: 30 tablet, Rfl: 0 .  ibuprofen (ADVIL,MOTRIN) 800 MG tablet, Take 1 tablet (800 mg total) by mouth 3 (three) times daily., Disp: 21 tablet, Rfl: 0 .  lisinopril-hydrochlorothiazide (PRINZIDE,ZESTORETIC) 20-25 MG per tablet, Take 1 tablet by mouth daily., Disp: , Rfl: 3 .  nabumetone (RELAFEN) 500 MG tablet, Take 500 mg by mouth 2 (two) times daily., Disp: , Rfl: 3 .  oxyCODONE-acetaminophen (PERCOCET) 10-325 MG tablet, Take 1 tablet by mouth every 4 (four) hours as needed for pain., Disp: 20 tablet, Rfl: 0 .  promethazine (PHENERGAN) 25 MG  tablet, Take 1 tablet (25 mg total) by mouth every 8 (eight) hours as needed for nausea or vomiting., Disp: 20 tablet, Rfl: 0  Social History   Tobacco Use  Smoking Status Never Smoker  Smokeless Tobacco Never Used    Allergies  Allergen Reactions  . Sulfa Antibiotics Swelling   Objective:   There were no vitals filed for this visit. There is no height or weight on file to calculate BMI. Constitutional Well developed. Well nourished.  Vascular Foot warm and well perfused. Capillary refill normal to all digits.   Neurologic Normal speech. Oriented to person, place, and time. Epicritic sensation to light touch grossly present bilaterally.  Dermatologic Skin well-healed with minor edema.  No warmth erythema no signs of acute infection.  Orthopedic: Tenderness to palpation noted about the surgical site. Second toe rectus with pin fixation   Radiographs: Taken and reviewed consistent with postop state osteotomies healing well Assessment:   1. Hallux valgus with bunions of right foot    Plan:  Patient was evaluated and treated and all questions answered.  S/p foot surgery right -Progressing as expected post-operatively. -XR: As above -WB Status: WBAT in surgical shoe. -Medications: Norco refilled -Plan for transition to weightbearing at that time  Return in about 2 weeks (around 06/02/2018) for Post-op, Right.  Needs x-rays at that visit

## 2018-05-24 DIAGNOSIS — M25571 Pain in right ankle and joints of right foot: Secondary | ICD-10-CM | POA: Diagnosis not present

## 2018-05-24 DIAGNOSIS — M25474 Effusion, right foot: Secondary | ICD-10-CM | POA: Diagnosis not present

## 2018-05-24 DIAGNOSIS — M25674 Stiffness of right foot, not elsewhere classified: Secondary | ICD-10-CM | POA: Diagnosis not present

## 2018-05-26 DIAGNOSIS — M25674 Stiffness of right foot, not elsewhere classified: Secondary | ICD-10-CM | POA: Diagnosis not present

## 2018-05-26 DIAGNOSIS — M25571 Pain in right ankle and joints of right foot: Secondary | ICD-10-CM | POA: Diagnosis not present

## 2018-05-26 DIAGNOSIS — M25474 Effusion, right foot: Secondary | ICD-10-CM | POA: Diagnosis not present

## 2018-05-29 DIAGNOSIS — M25571 Pain in right ankle and joints of right foot: Secondary | ICD-10-CM | POA: Diagnosis not present

## 2018-05-29 DIAGNOSIS — M25674 Stiffness of right foot, not elsewhere classified: Secondary | ICD-10-CM | POA: Diagnosis not present

## 2018-05-29 DIAGNOSIS — M25474 Effusion, right foot: Secondary | ICD-10-CM | POA: Diagnosis not present

## 2018-06-02 ENCOUNTER — Ambulatory Visit (INDEPENDENT_AMBULATORY_CARE_PROVIDER_SITE_OTHER): Payer: 59

## 2018-06-02 ENCOUNTER — Ambulatory Visit (INDEPENDENT_AMBULATORY_CARE_PROVIDER_SITE_OTHER): Admitting: Podiatry

## 2018-06-02 ENCOUNTER — Ambulatory Visit: Payer: Self-pay

## 2018-06-02 DIAGNOSIS — M21611 Bunion of right foot: Secondary | ICD-10-CM | POA: Diagnosis not present

## 2018-06-02 DIAGNOSIS — M2011 Hallux valgus (acquired), right foot: Secondary | ICD-10-CM | POA: Diagnosis not present

## 2018-06-13 NOTE — Progress Notes (Signed)
  Subjective:  Patient ID: Shelia Sanford, female    DOB: 1956/08/05,  MRN: [id number]  No chief complaint on file.  DOS: 04/05/18 Procedure: [name] bunionectomy, metatarsal osteotomy, removal of deep fixation, hammertoe repair 2nd  62 y.o. female returns for post-op check.  Doing great still having some occasional pains but otherwise no complaints.  Review of Systems: Negative except as noted in the HPI. Denies N/V/F/Ch.  Past Medical History:  Diagnosis Date  . High cholesterol   . Hypertension     Current Outpatient Medications:  .  ALPRAZolam (XANAX) 1 MG tablet, Take 0.5-1 mg by mouth 3 (three) times daily as needed for anxiety. , Disp: , Rfl: 0 .  amLODipine (NORVASC) 5 MG tablet, Take 5 mg by mouth daily., Disp: , Rfl: 3 .  aspirin 325 MG tablet, Take 325 mg by mouth daily., Disp: , Rfl:  .  atorvastatin (LIPITOR) 20 MG tablet, Take 20 mg by mouth daily., Disp: , Rfl: 3 .  cephALEXin (KEFLEX) 500 MG capsule, Take 1 capsule (500 mg total) by mouth 3 (three) times daily., Disp: 21 capsule, Rfl: 0 .  HYDROcodone-acetaminophen (NORCO/VICODIN) 5-325 MG tablet, Take 1 tablet by mouth every 6 (six) hours as needed for moderate pain., Disp: 30 tablet, Rfl: 0 .  ibuprofen (ADVIL,MOTRIN) 800 MG tablet, Take 1 tablet (800 mg total) by mouth 3 (three) times daily., Disp: 21 tablet, Rfl: 0 .  lisinopril-hydrochlorothiazide (PRINZIDE,ZESTORETIC) 20-25 MG per tablet, Take 1 tablet by mouth daily., Disp: , Rfl: 3 .  nabumetone (RELAFEN) 500 MG tablet, Take 500 mg by mouth 2 (two) times daily., Disp: , Rfl: 3 .  oxyCODONE-acetaminophen (PERCOCET) 10-325 MG tablet, Take 1 tablet by mouth every 4 (four) hours as needed for pain., Disp: 20 tablet, Rfl: 0 .  promethazine (PHENERGAN) 25 MG tablet, Take 1 tablet (25 mg total) by mouth every 8 (eight) hours as needed for nausea or vomiting., Disp: 20 tablet, Rfl: 0  Social History   Tobacco Use  Smoking Status Never Smoker  Smokeless Tobacco Never  Used    Allergies  Allergen Reactions  . Sulfa Antibiotics Swelling   Objective:   There were no vitals filed for this visit. There is no height or weight on file to calculate BMI. Constitutional Well developed. Well nourished.  Vascular Foot warm and well perfused. Capillary refill normal to all digits.   Neurologic Normal speech. Oriented to person, place, and time. Epicritic sensation to light touch grossly present bilaterally.  Dermatologic Skin well-healed with minor edema.  No warmth erythema no signs of acute infection.  Orthopedic: Tenderness to palpation noted about the surgical site. Second toe rectus   Radiographs: Taken and reviewed consistent with postop state osteotomy healed. Assessment:   1. Hallux valgus with bunions of right foot    Plan:  Patient was evaluated and treated and all questions answered.  S/p foot surgery right -Progressing as expected post-operatively. -XR: As above -WB Status: WBAT in normal shoe -Medications: none refilled.  Return in about 4 weeks (around 06/30/2018) for Post-op.

## 2018-07-07 ENCOUNTER — Encounter: Admitting: Podiatry

## 2018-08-18 ENCOUNTER — Ambulatory Visit (INDEPENDENT_AMBULATORY_CARE_PROVIDER_SITE_OTHER)

## 2018-08-18 ENCOUNTER — Ambulatory Visit (INDEPENDENT_AMBULATORY_CARE_PROVIDER_SITE_OTHER): Admitting: Podiatry

## 2018-08-18 DIAGNOSIS — M21611 Bunion of right foot: Secondary | ICD-10-CM | POA: Diagnosis not present

## 2018-08-18 DIAGNOSIS — M2041 Other hammer toe(s) (acquired), right foot: Secondary | ICD-10-CM

## 2018-08-18 DIAGNOSIS — M2011 Hallux valgus (acquired), right foot: Secondary | ICD-10-CM

## 2018-08-18 NOTE — Patient Instructions (Signed)
Pre-Operative Instructions  Congratulations, you have decided to take an important step towards improving your quality of life.  You can be assured that the doctors and staff at [facility] will be with you every step of the way.  Here are some important things you should know:  1. Plan to be at the surgery center/hospital at least 1 (one) hour prior to your scheduled time, unless otherwise directed by the surgical center/hospital staff.  You must have a responsible adult accompany you, remain during the surgery and drive you home.  Make sure you have directions to the surgical center/hospital to ensure you arrive on time. 2. If you are having surgery at [facility] or [facility], you will need a copy of your medical history and physical form from your family physician within one month prior to the date of surgery. We will give you a form for your primary physician to complete.  3. We make every effort to accommodate the date you request for surgery.  However, there are times where surgery dates or times have to be moved.  We will contact you as soon as possible if a change in schedule is required.   4. No aspirin/ibuprofen for one week before surgery.  If you are on aspirin, any non-steroidal anti-inflammatory medications (Mobic, Aleve, Ibuprofen) should not be taken seven (7) days prior to your surgery.  You make take Tylenol for pain prior to surgery.  5. Medications - If you are taking daily heart and blood pressure medications, seizure, reflux, allergy, asthma, anxiety, pain or diabetes medications, make sure you notify the surgery center/hospital before the day of surgery so they can tell you which medications you should take or avoid the day of surgery. 6. No food or drink after midnight the night before surgery unless directed otherwise by surgical center/hospital staff. 7. No alcoholic beverages 24-hours prior to surgery.  No smoking 24-hours prior or 24-hours after  surgery. 8. Wear loose pants or shorts. They should be loose enough to fit over bandages, boots, and casts. 9. Don't wear slip-on shoes. Sneakers are preferred. 10. Bring your boot with you to the surgery center/hospital.  Also bring crutches or a walker if your physician has prescribed it for you.  If you do not have this equipment, it will be provided for you after surgery. 11. If you have not been contacted by the surgery center/hospital by the day before your surgery, call to confirm the date and time of your surgery. 12. Leave-time from work may vary depending on the type of surgery you have.  Appropriate arrangements should be made prior to surgery with your employer. 13. Prescriptions will be provided immediately following surgery by your doctor.  Fill these as soon as possible after surgery and take the medication as directed. Pain medications will not be refilled on weekends and must be approved by the doctor. 14. Remove nail polish on the operative foot and avoid getting pedicures prior to surgery. 15. Wash the night before surgery.  The night before surgery wash the foot and leg well with water and the antibacterial soap provided. Be sure to pay special attention to beneath the toenails and in between the toes.  Wash for at least three (3) minutes. Rinse thoroughly with water and dry well with a towel.  Perform this wash unless told not to do so by your physician.  Enclosed: 1 Ice pack (please put in freezer the night before surgery)   1 Hibiclens skin cleaner     Pre-op instructions  If you have any questions regarding the instructions, please do not hesitate to call our office.  [place]: [address], [place], [place] [address] -- [phone]  [place]: [address] [place], [place] [address] -- [phone]  [place]: [address]  [place], [place] [address] -- [phone]  [place]: [address], [address], [place], [place] [address] -- [phone]  Website:  [email or web address] 

## 2018-08-27 NOTE — Progress Notes (Signed)
Subjective:  Patient ID: Shelia Sanford, female    DOB: [birth year],  MRN: [id number]  Chief Complaint  Patient presents with  . Foot Pain    right    63 y.o. female presents with the above complaint.  States that she had a fall at the end of a dog show in the second toe stay swollen and painful states the bunion never really got 100% feeling better though it does feel better prior to what it did before surgery.  Review of Systems: Negative except as noted in the HPI. Denies N/V/F/Ch.  Past Medical History:  Diagnosis Date  . High cholesterol   . Hypertension     Current Outpatient Medications:  .  ALPRAZolam (XANAX) 1 MG tablet, Take 0.5-1 mg by mouth 3 (three) times daily as needed for anxiety. , Disp: , Rfl: 0 .  amLODipine (NORVASC) 5 MG tablet, Take 5 mg by mouth daily., Disp: , Rfl: 3 .  aspirin 325 MG tablet, Take 325 mg by mouth daily., Disp: , Rfl:  .  atorvastatin (LIPITOR) 20 MG tablet, Take 20 mg by mouth daily., Disp: , Rfl: 3 .  cephALEXin (KEFLEX) 500 MG capsule, Take 1 capsule (500 mg total) by mouth 3 (three) times daily., Disp: 21 capsule, Rfl: 0 .  HYDROcodone-acetaminophen (NORCO/VICODIN) 5-325 MG tablet, Take 1 tablet by mouth every 6 (six) hours as needed for moderate pain., Disp: 30 tablet, Rfl: 0 .  ibuprofen (ADVIL,MOTRIN) 800 MG tablet, Take 1 tablet (800 mg total) by mouth 3 (three) times daily., Disp: 21 tablet, Rfl: 0 .  lisinopril-hydrochlorothiazide (PRINZIDE,ZESTORETIC) 20-25 MG per tablet, Take 1 tablet by mouth daily., Disp: , Rfl: 3 .  nabumetone (RELAFEN) 500 MG tablet, Take 500 mg by mouth 2 (two) times daily., Disp: , Rfl: 3 .  oxyCODONE-acetaminophen (PERCOCET) 10-325 MG tablet, Take 1 tablet by mouth every 4 (four) hours as needed for pain., Disp: 20 tablet, Rfl: 0 .  promethazine (PHENERGAN) 25 MG tablet, Take 1 tablet (25 mg total) by mouth every 8 (eight) hours as needed for nausea or vomiting., Disp: 20 tablet, Rfl: 0  Social History    Tobacco Use  Smoking Status Never Smoker  Smokeless Tobacco Never Used    Allergies  Allergen Reactions  . Sulfa Antibiotics Swelling   Objective:  There were no vitals filed for this visit. There is no height or weight on file to calculate BMI. Constitutional Well developed. Well nourished.  Vascular Dorsalis pedis pulses palpable bilaterally. Posterior tibial pulses palpable bilaterally. Capillary refill normal to all digits.  No cyanosis or clubbing noted. Pedal hair growth normal.  Neurologic Normal speech. Oriented to person, place, and time. Epicritic sensation to light touch grossly present bilaterally.  Dermatologic Nails well groomed and normal in appearance. No open wounds. No skin lesions.  Orthopedic: Hallux valgus right with slight residual first metatarsal phalangeal joint stiffness, second MPJ slight dorsiflexion deformity, third hammertoe deformity   Radiographs: Taken and reviewed slight subchondral sclerosis slight residual hallux valgus deformity hammertoes 3rd toe Assessment:   1. Hallux valgus with bunions of right foot   2. Hammer toe of right foot    Plan:  Patient was evaluated and treated and all questions answered.  Hallux valgus right, hammertoe -Still having pain would like to proceed with revision. -Patient has failed all conservative therapy and wishes to proceed with surgical intervention. All risks, benefits, and alternatives discussed with patient. No guarantees given. Consent reviewed and signed by patient. -Planned procedures: Right  foot revision of bunion with possible joint replacement, tenotomy and capsulotomy of the second metatarsal phalangeal joint, correction of third toe hammertoe  Return for post op.

## 2018-11-06 DIAGNOSIS — M545 Low back pain: Secondary | ICD-10-CM | POA: Diagnosis not present

## 2018-11-06 DIAGNOSIS — I10 Essential (primary) hypertension: Secondary | ICD-10-CM | POA: Diagnosis not present

## 2018-11-09 ENCOUNTER — Other Ambulatory Visit: Payer: Self-pay | Admitting: Family Medicine

## 2018-11-09 DIAGNOSIS — Z1231 Encounter for screening mammogram for malignant neoplasm of breast: Secondary | ICD-10-CM

## 2019-02-20 ENCOUNTER — Ambulatory Visit: Payer: No Typology Code available for payment source

## 2019-12-28 ENCOUNTER — Other Ambulatory Visit: Payer: Self-pay | Admitting: Family Medicine

## 2019-12-28 DIAGNOSIS — Z1231 Encounter for screening mammogram for malignant neoplasm of breast: Secondary | ICD-10-CM

## 2019-12-28 DIAGNOSIS — E2839 Other primary ovarian failure: Secondary | ICD-10-CM

## 2020-02-04 ENCOUNTER — Other Ambulatory Visit: Payer: Self-pay | Admitting: Student

## 2020-02-04 DIAGNOSIS — S82851D Displaced trimalleolar fracture of right lower leg, subsequent encounter for closed fracture with routine healing: Secondary | ICD-10-CM

## 2020-02-26 ENCOUNTER — Ambulatory Visit
Admission: RE | Admit: 2020-02-26 | Discharge: 2020-02-26 | Disposition: A | Discharge status: Home / Self Care | Payer: No Typology Code available for payment source | Source: Ambulatory Visit | Attending: Student | Admitting: Student

## 2020-02-26 ENCOUNTER — Ambulatory Visit
Admission: RE | Admit: 2020-02-26 | Discharge: 2020-02-26 | Disposition: A | Discharge status: Home / Self Care | Payer: 59 | Source: Ambulatory Visit | Attending: Family Medicine | Admitting: Family Medicine

## 2020-02-26 ENCOUNTER — Other Ambulatory Visit: Payer: Self-pay

## 2020-02-26 DIAGNOSIS — Z1231 Encounter for screening mammogram for malignant neoplasm of breast: Secondary | ICD-10-CM

## 2020-02-26 DIAGNOSIS — S82851D Displaced trimalleolar fracture of right lower leg, subsequent encounter for closed fracture with routine healing: Secondary | ICD-10-CM

## 2020-09-11 ENCOUNTER — Other Ambulatory Visit: Payer: Self-pay | Admitting: Family Medicine

## 2020-09-11 DIAGNOSIS — E2839 Other primary ovarian failure: Secondary | ICD-10-CM

## 2020-09-11 DIAGNOSIS — Z780 Asymptomatic menopausal state: Secondary | ICD-10-CM

## 2021-09-19 IMAGING — CT CT ANKLE*R* W/O CM
3 series · 12 of 35 positions shown, 14 images · non-contrast
Comparison: None available.

CLINICAL DATA: Trimalleolar fracture

EXAM:
CT OF THE RIGHT ANKLE WITHOUT CONTRAST
TECHNIQUE: Multidetector CT imaging of the right ankle was performed according
to the standard protocol. Multiplanar CT image reconstructions were
also generated.

[Series 5: sfov lower extremity 2.00 br40 s3 soft (person_nam · axial · 0.29mm/px · z∈[+553,+705]mm · 4 of 110 slices shown, 5 images (1 of 3)]
[im 17/110  soft-tissue]
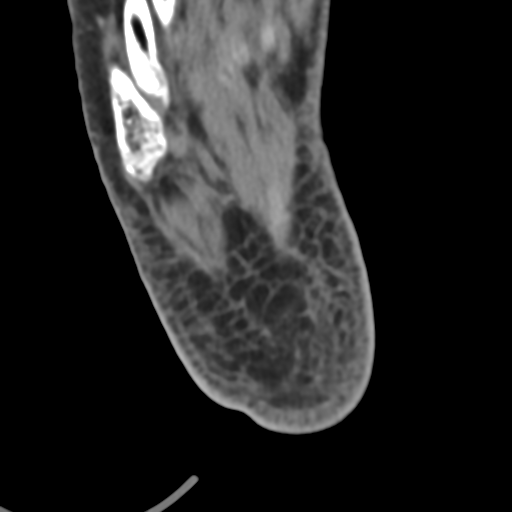
[im 17/110  bone]
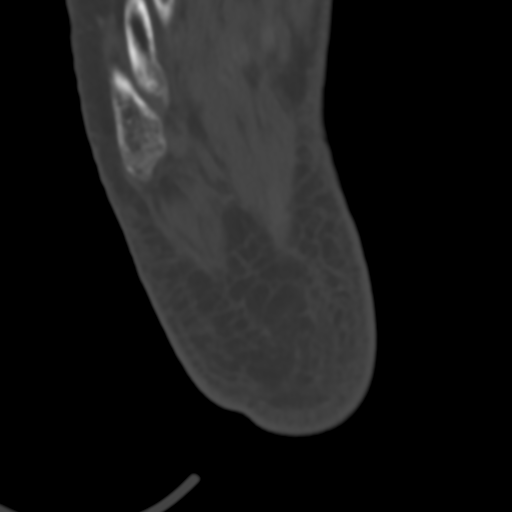
[im 42/110  bone]
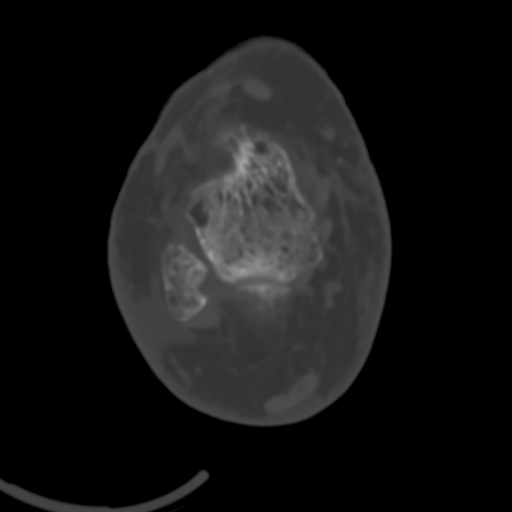
[im 68/110  bone]
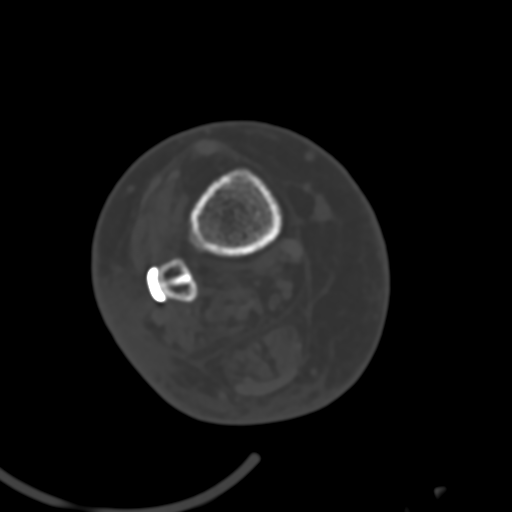
[im 93/110  bone]
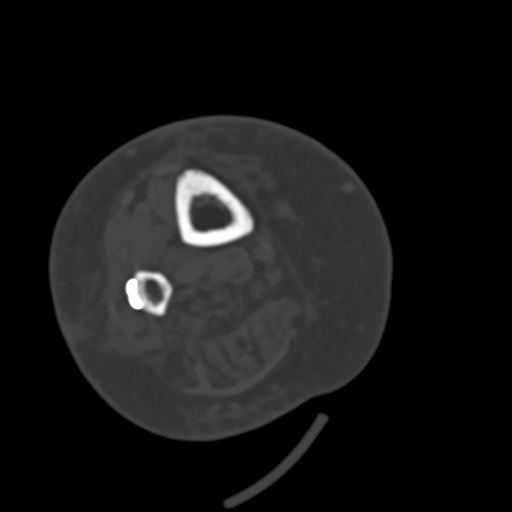

[Series 9: sfov lower extremity 2.00 br40 s3 soft (person_nam · coronal · 0.29mm/px · 3 of 74 slices shown (2 of 3)]
[im 15/74  bone]
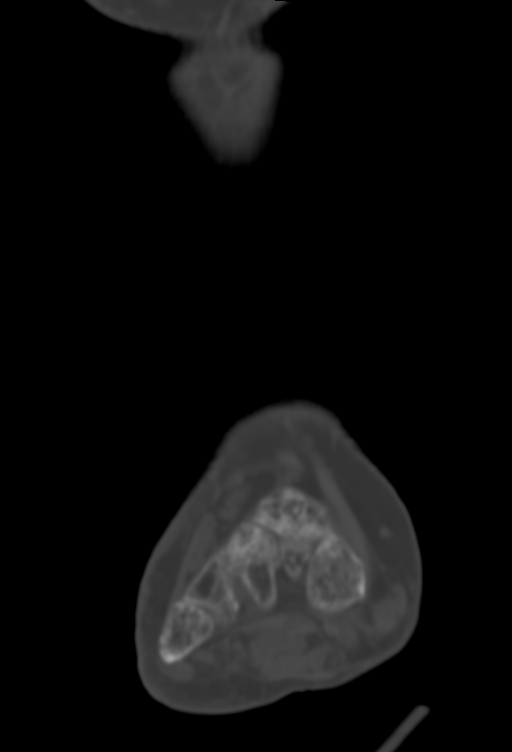
[im 30/74  bone]
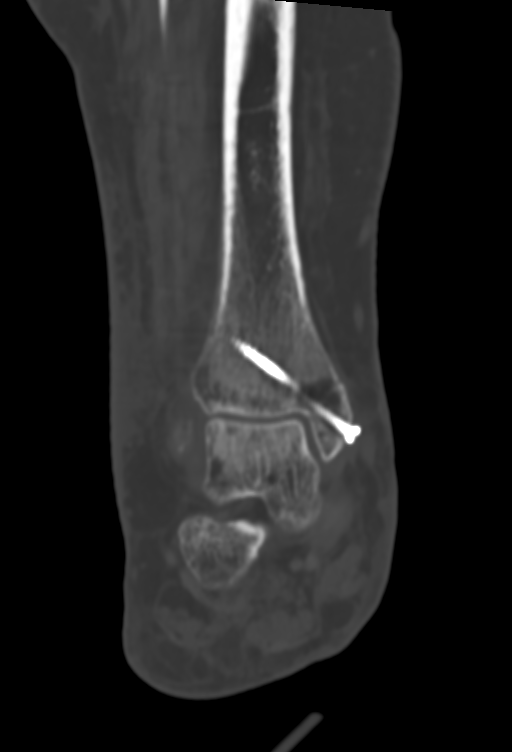
[im 44/74  bone]
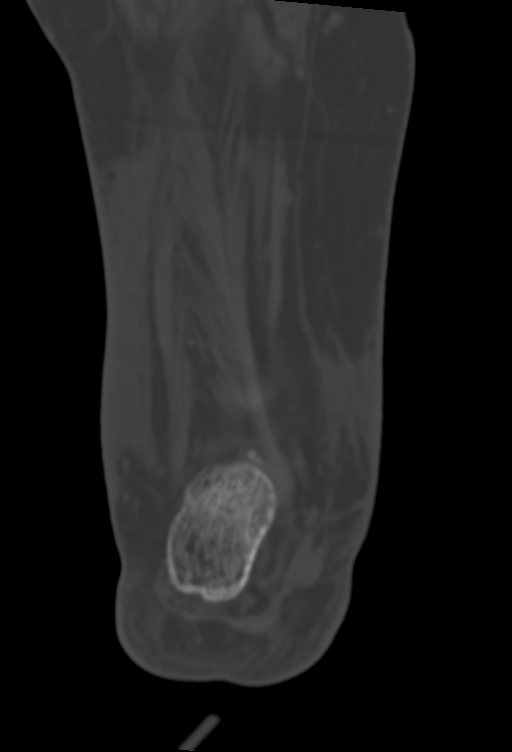

[Series 13: sfov lower extremity 2.00 br40 s3 soft (person_nam · sagittal · 0.29mm/px · 5 of 75 slices shown, 6 images (3 of 3)]
[im 25/75  bone]
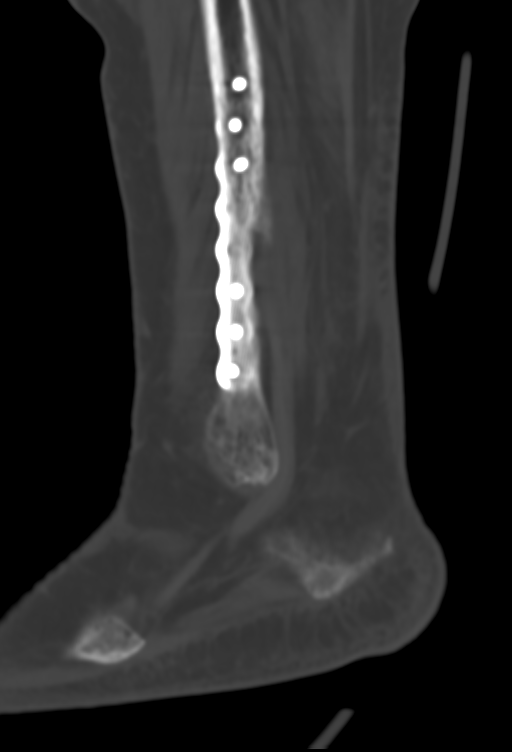
[im 31/75  bone]
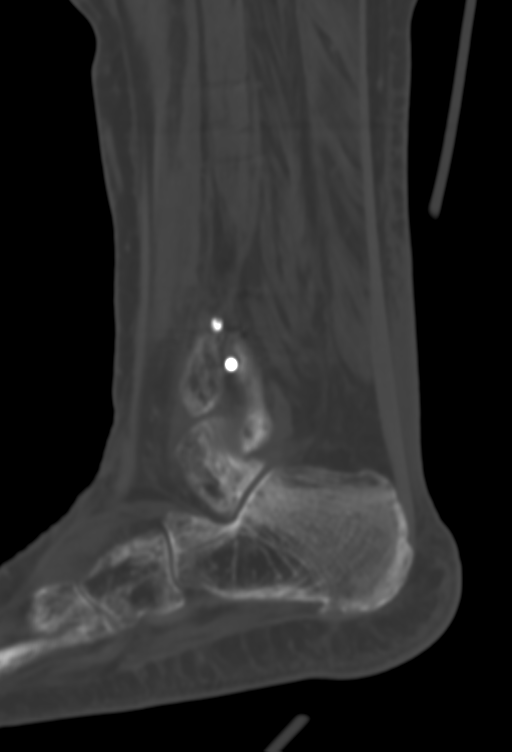
[im 38/75  soft-tissue]
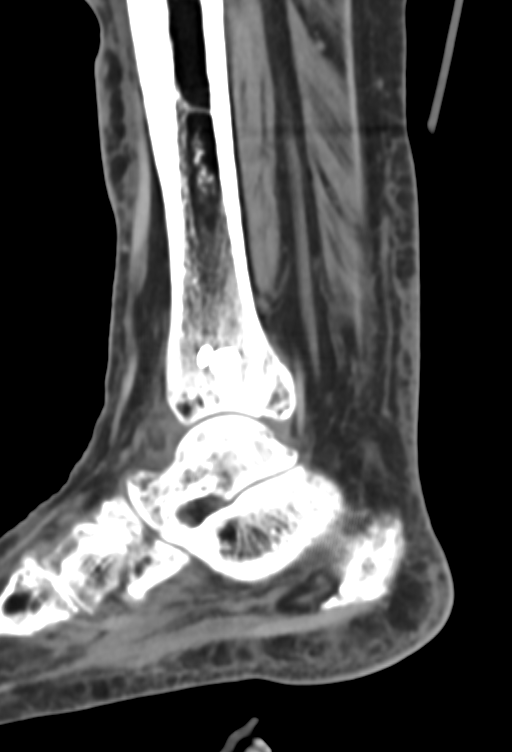
[im 38/75  bone]
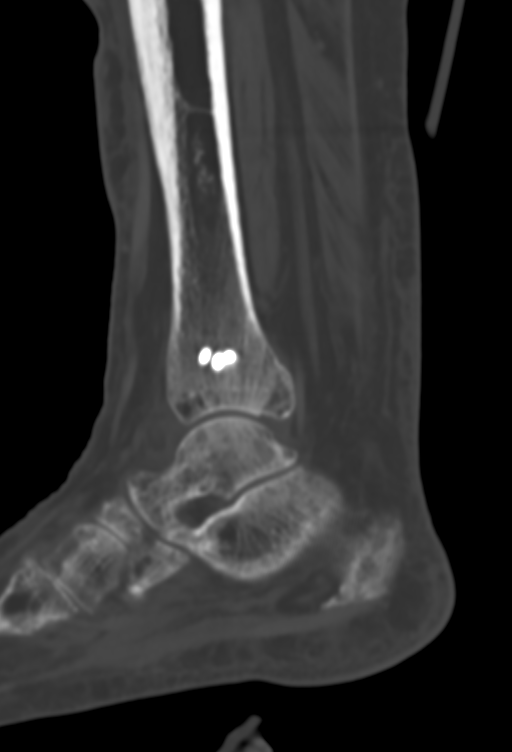
[im 44/75  bone]
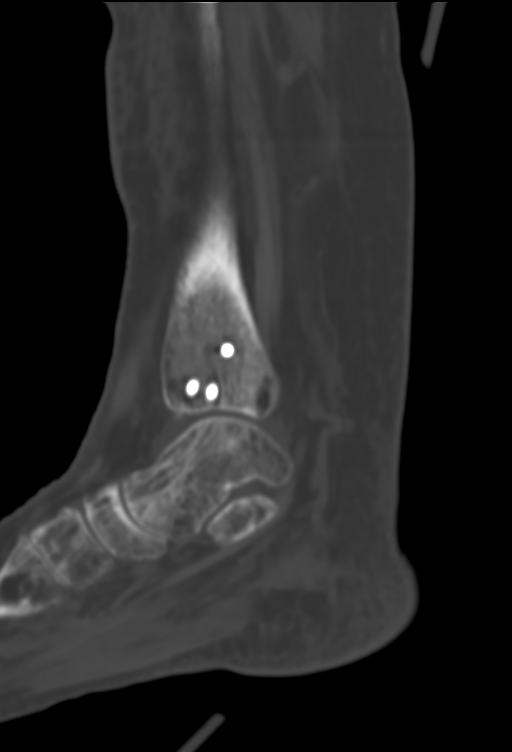
[im 50/75  bone]
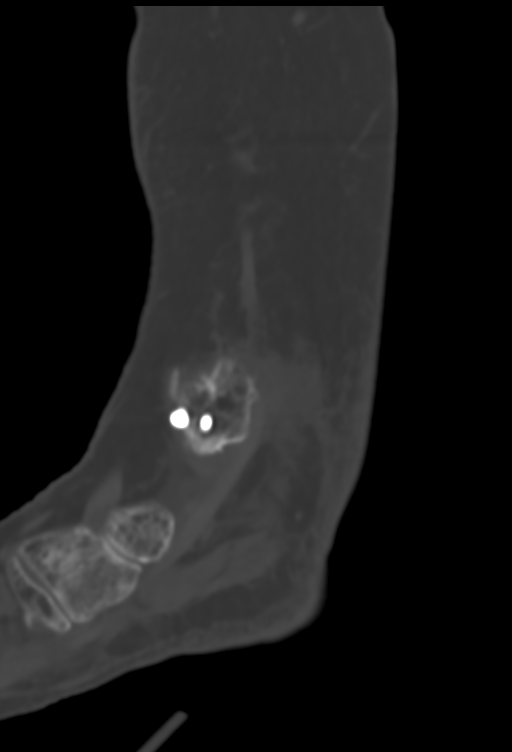

[12 of 35 positions shown; findings below may reference images not displayed]

FINDINGS: Bones/Joint/Cartilage

Patient is status post ORIF of trimalleolar right ankle fracture
with lateral fibular sideplate and screw fixation construct, single
transsyndesmotic screw, and 2 partially threaded medial malleolar
fixation screws. Hardware is intact. Fracture lines are well healed.
No perihardware fracture. There is thin perihardware lucency around
the transsyndesmotic screw within the fibula (series 11, image 29).
No lucency within the tibial component of the hardware.

Diffuse osseous demineralization. No acute fracture. Ankle mortise
is congruent. Anatomic alignment of the osseous structures.
Mild-to-moderate joint space narrowing throughout the
tarsometatarsal joints, most pronounced at the second and third TMT
joints.

Ligaments

Suboptimally assessed by CT.

Muscles and Tendons

No acute abnormality within the limitations of CT.

Soft tissues

Soft tissue thickening overlie the medial and lateral malleoli. No
organized soft tissue fluid collection.
IMPRESSION: 1. Status post ORIF of trimalleolar right ankle fracture. Fracture
lines are well healed. No acute findings.
2. Thin perihardware lucency around the proximal aspect of the
transsyndesmotic screw within the fibula. No overt evidence of
hardware loosening.
3. Mild-to-moderate joint space narrowing throughout the
tarsometatarsal joints, most pronounced at the second and third TMT
joints.

## 2021-09-19 IMAGING — MG DIGITAL SCREENING BILAT W/ TOMO W/ CAD
6 of 10 series · 6 of 30 positions shown · non-contrast
Comparison: None.

CLINICAL DATA: Screening.

EXAM:
DIGITAL SCREENING BILATERAL MAMMOGRAM WITH TOMO AND CAD

[R MLO synth-2D]
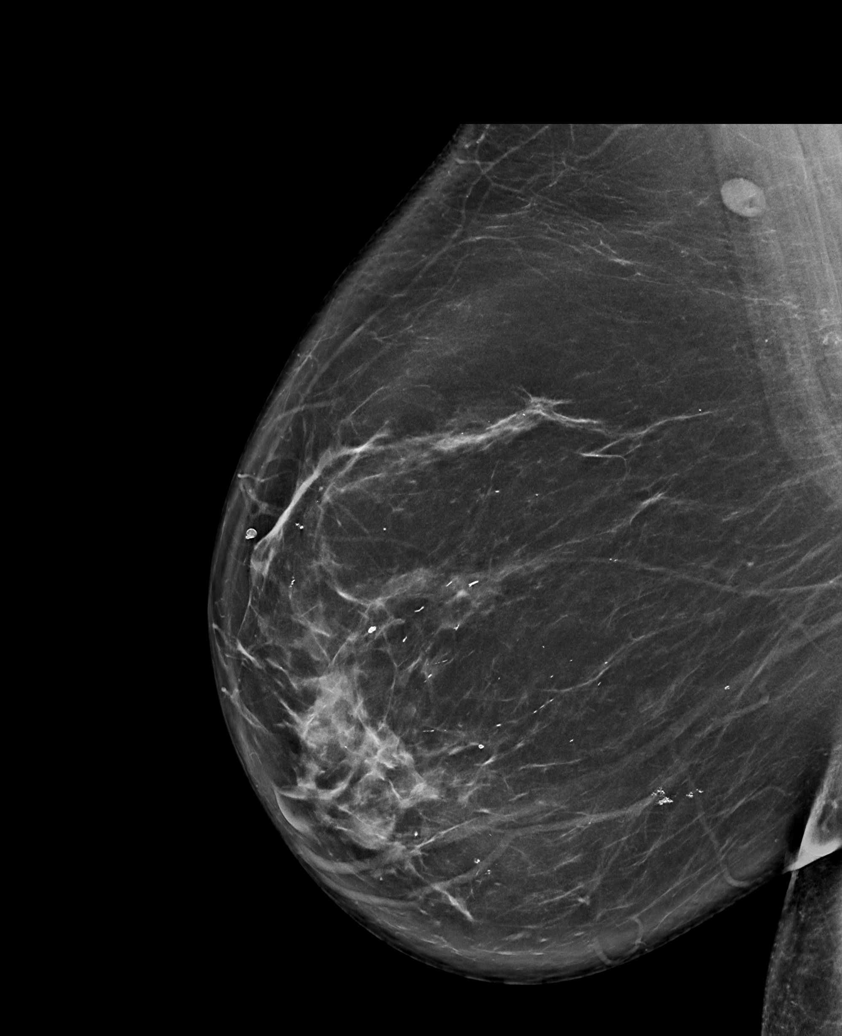

[L MLO synth-2D]
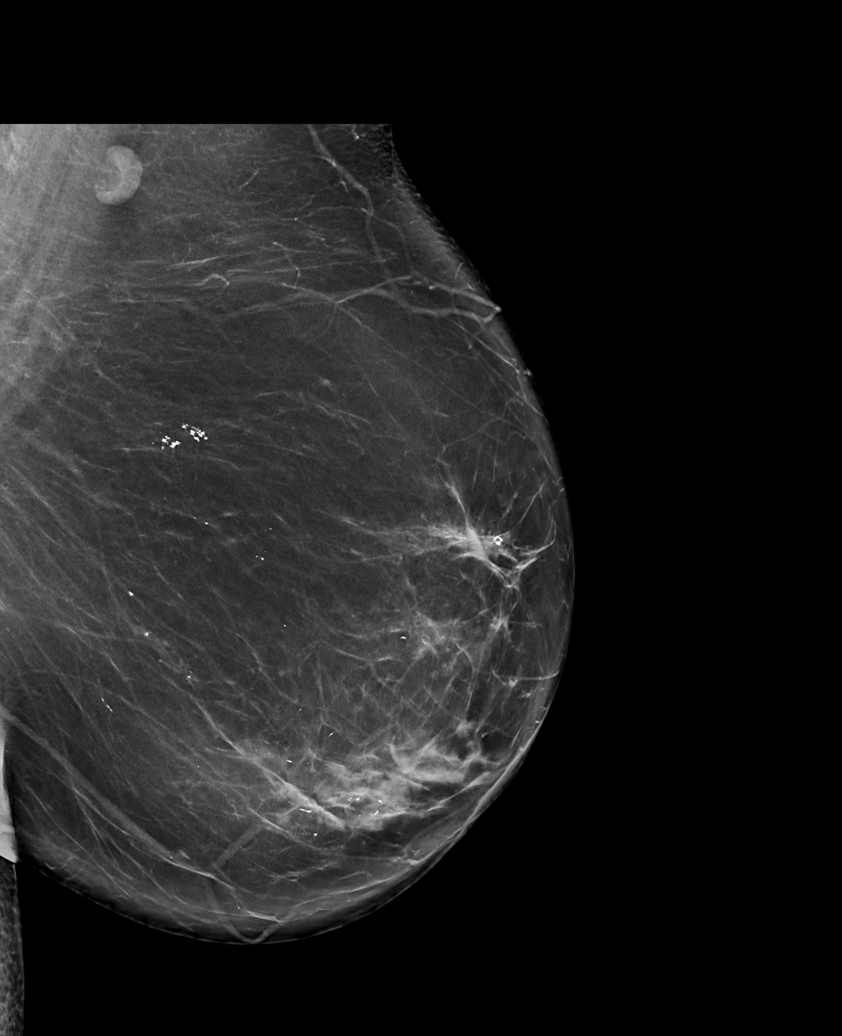

[L CC synth-2D]
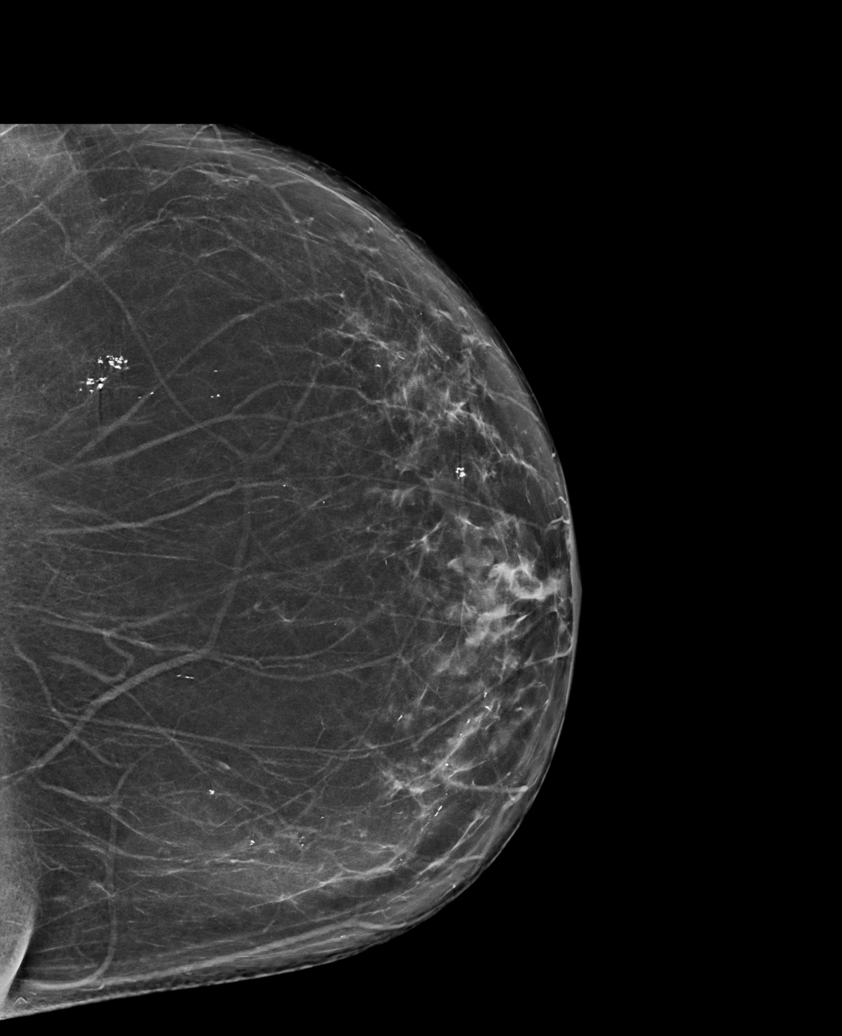

[R CC synth-2D]
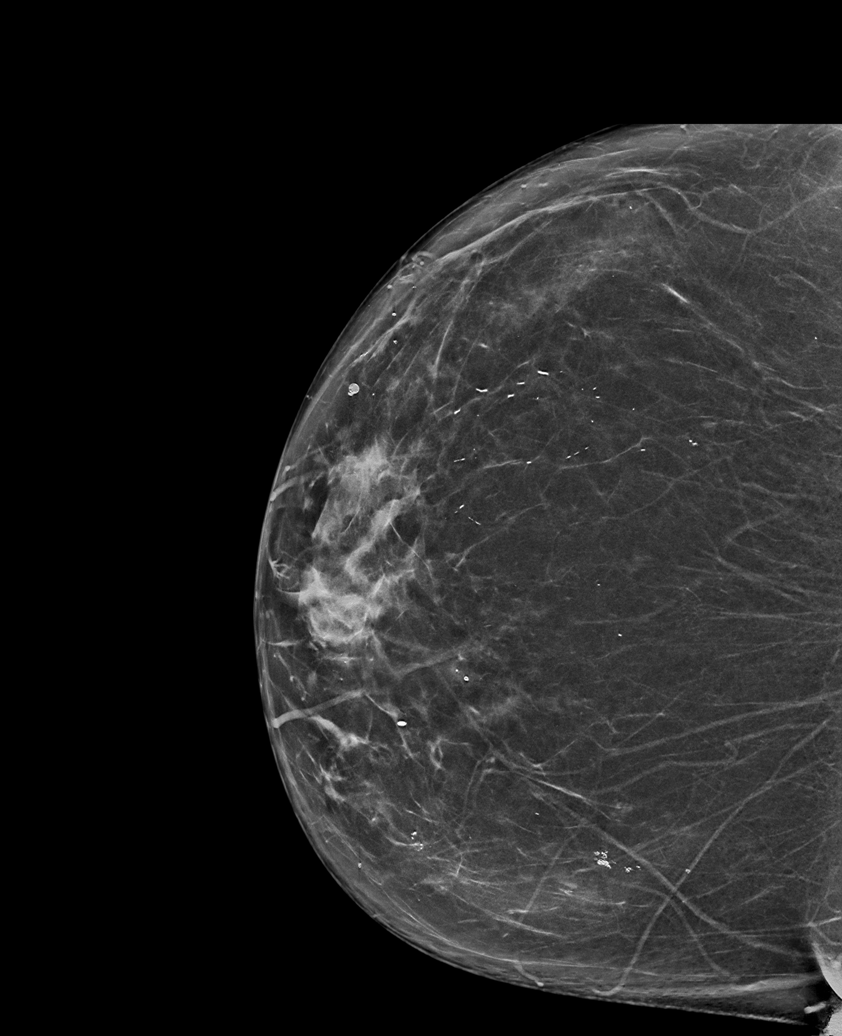

[R CV synth-2D]
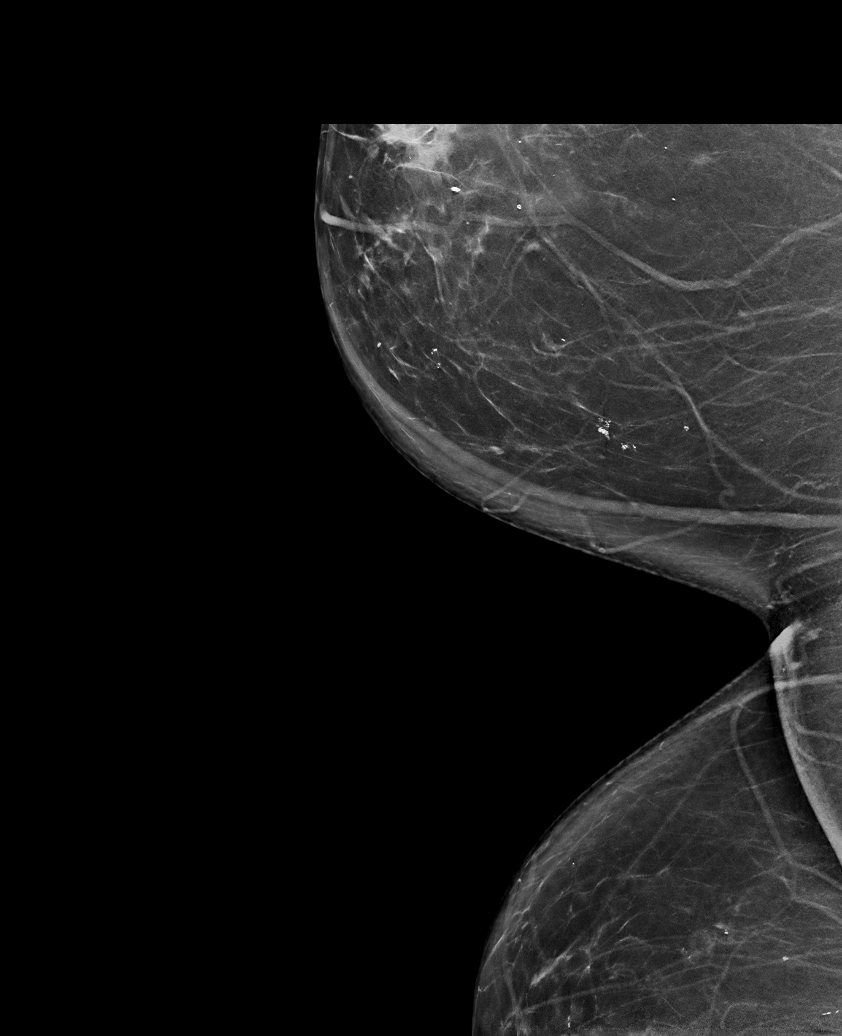

[L MLO tomo · tomo slice 47/94.0]
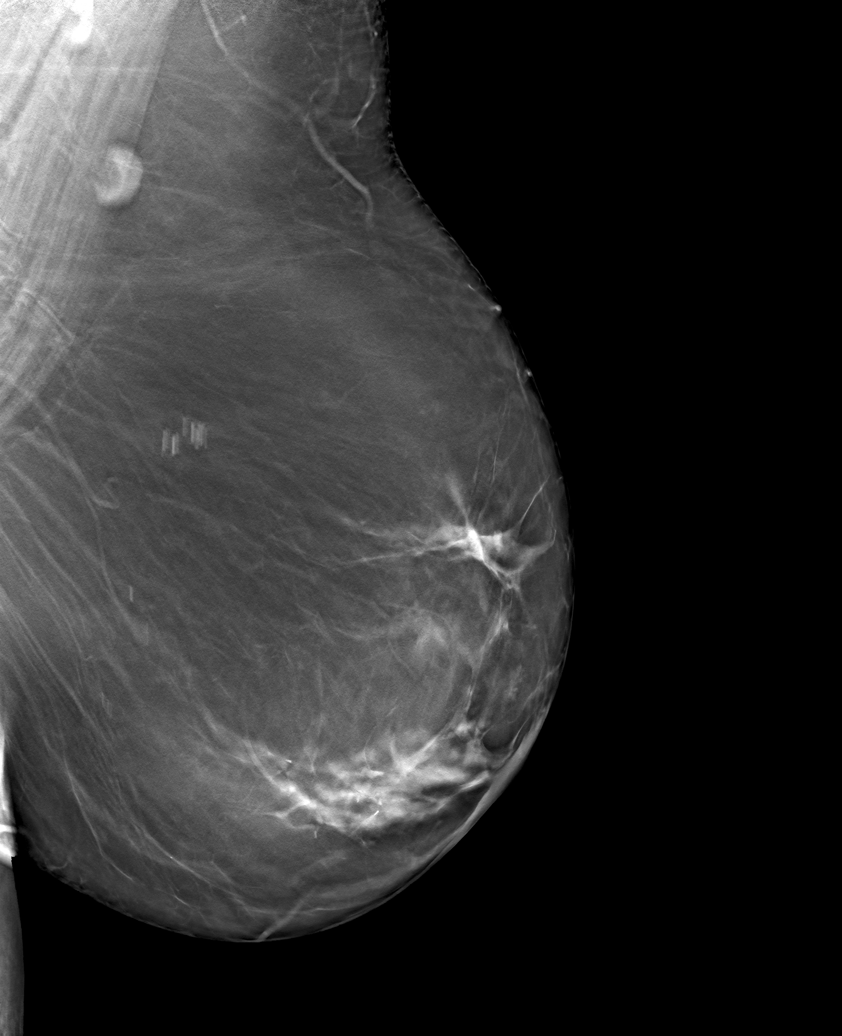

[6 of 30 positions shown; findings below may reference images not displayed]

ACR Breast Density Category b: There are scattered areas of
fibroglandular density.
FINDINGS: There are no findings suspicious for malignancy. Images were
processed with CAD.
IMPRESSION: No mammographic evidence of malignancy. A result letter of this
screening mammogram will be mailed directly to the patient.

RECOMMENDATION:
Screening mammogram in one year. (Code:Y5-G-EJ6)

BI-RADS CATEGORY  1: Negative.

## 2021-12-18 ENCOUNTER — Other Ambulatory Visit: Payer: Self-pay | Admitting: Family Medicine

## 2021-12-18 DIAGNOSIS — R748 Abnormal levels of other serum enzymes: Secondary | ICD-10-CM

## 2022-01-05 ENCOUNTER — Ambulatory Visit
Admission: RE | Admit: 2022-01-05 | Discharge: 2022-01-05 | Disposition: A | Discharge status: Home / Self Care | Payer: 59 | Source: Ambulatory Visit | Attending: Family Medicine | Admitting: Family Medicine

## 2022-01-05 DIAGNOSIS — R748 Abnormal levels of other serum enzymes: Secondary | ICD-10-CM

## 2023-07-30 IMAGING — US US ABDOMEN LIMITED
1 series · 14 of 25 positions shown · non-contrast
Comparison: None Available.

CLINICAL DATA: Abnormal LFTs.

EXAM:
ULTRASOUND ABDOMEN LIMITED RIGHT UPPER QUADRANT

[Series 1: us abdomen limited · 0.25mm/px · 14 of 48 slices shown]
[im 1/48]
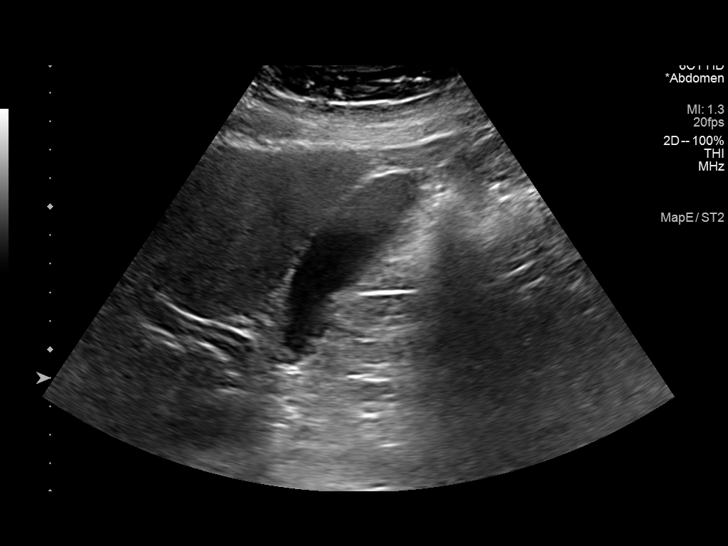
[im 4/48]
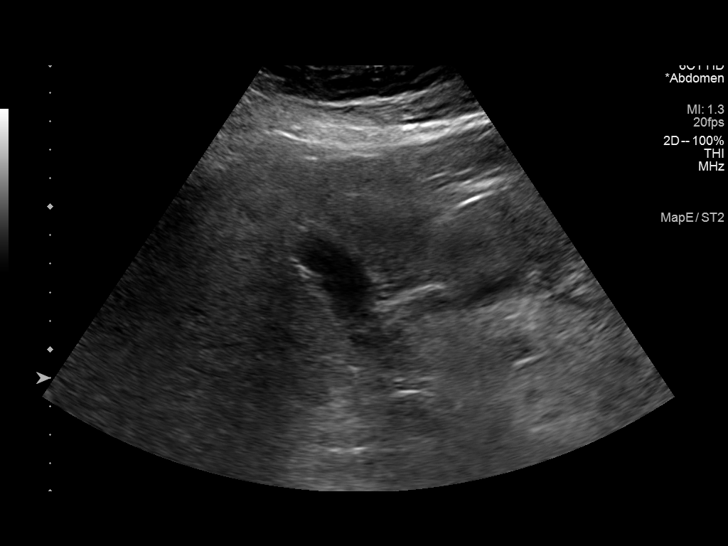
[im 8/48]
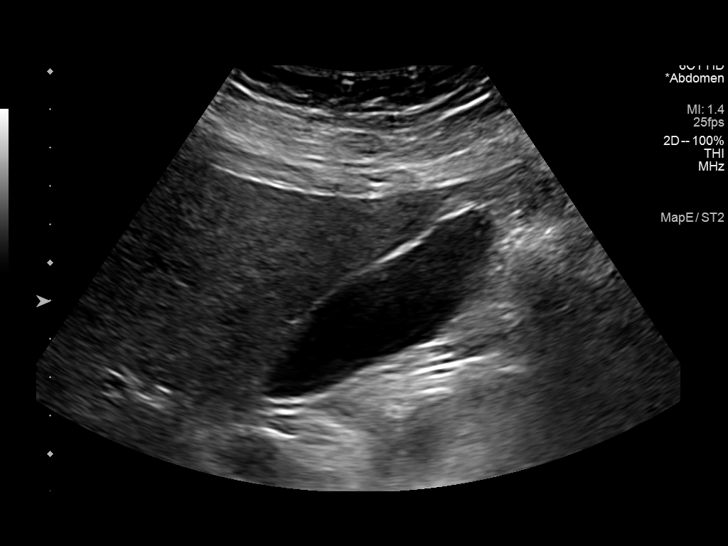
[im 12/48]
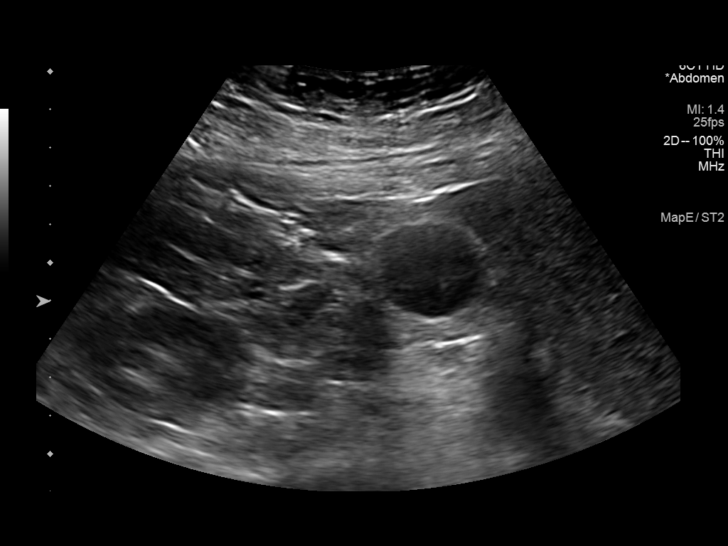
[im 16/48]
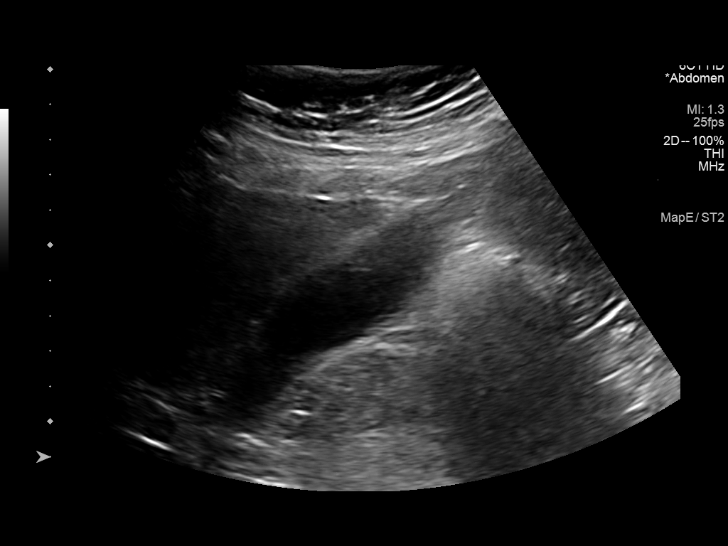
[im 18/48]
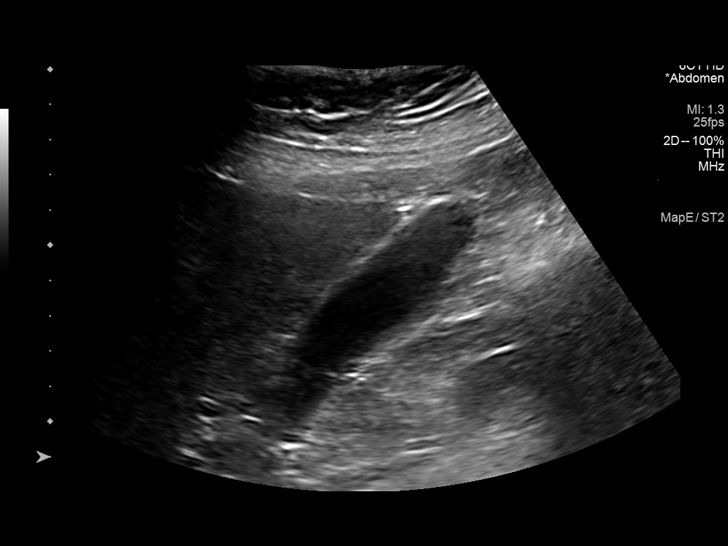
[im 22/48]
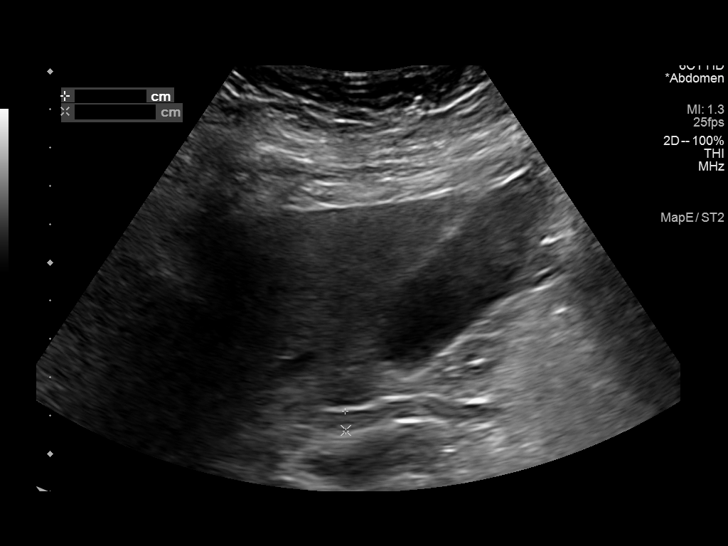
[im 26/48]
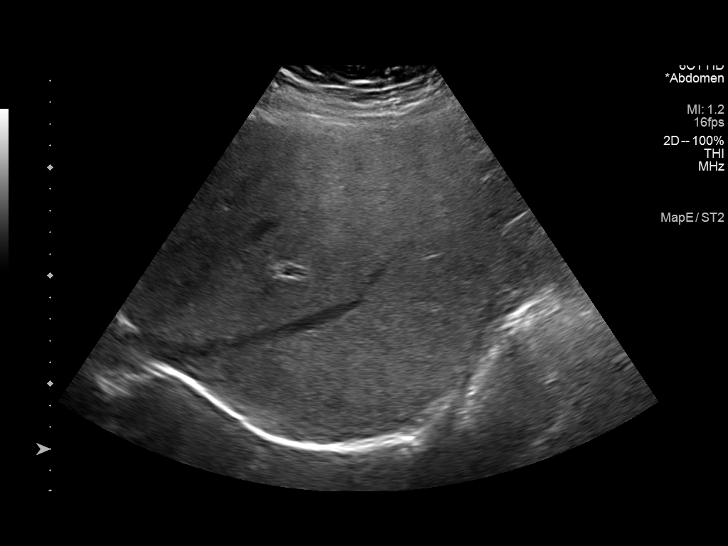
[im 30/48]
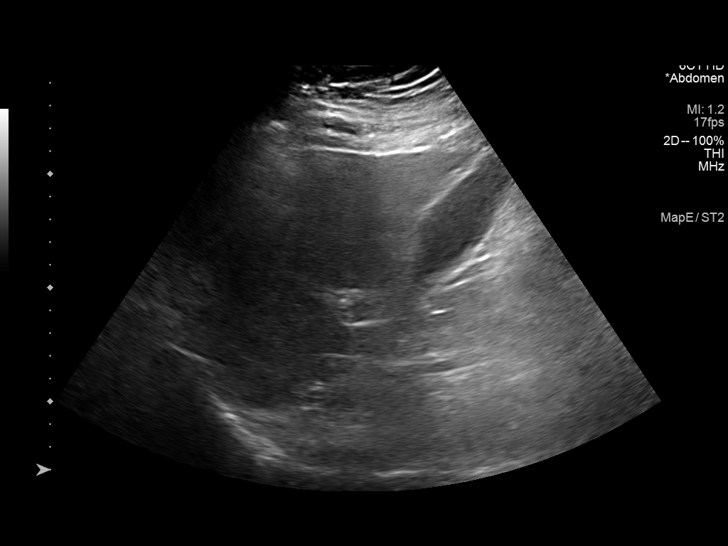
[im 32/48]
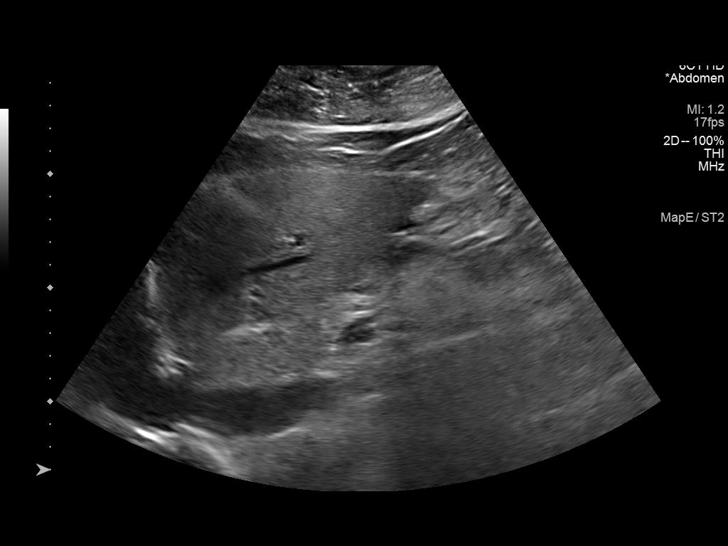
[im 36/48]
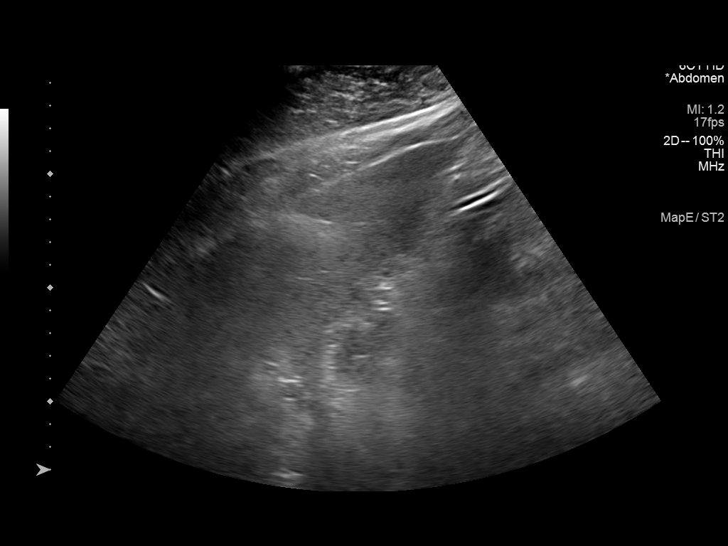
[im 40/48]
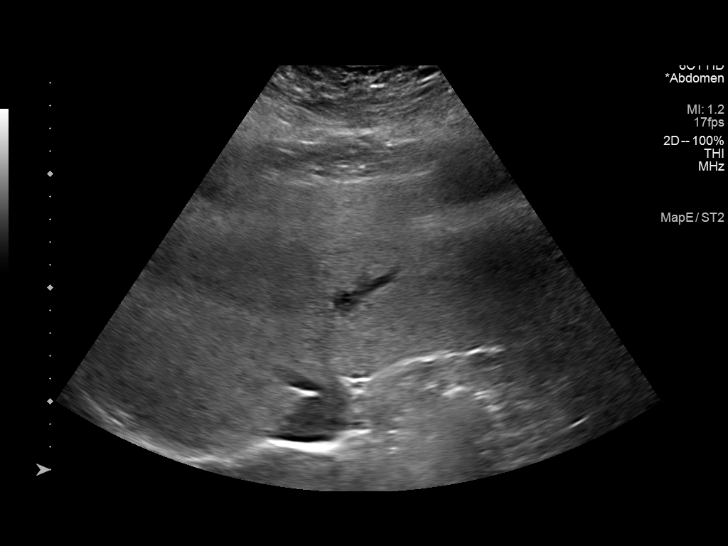
[im 44/48]
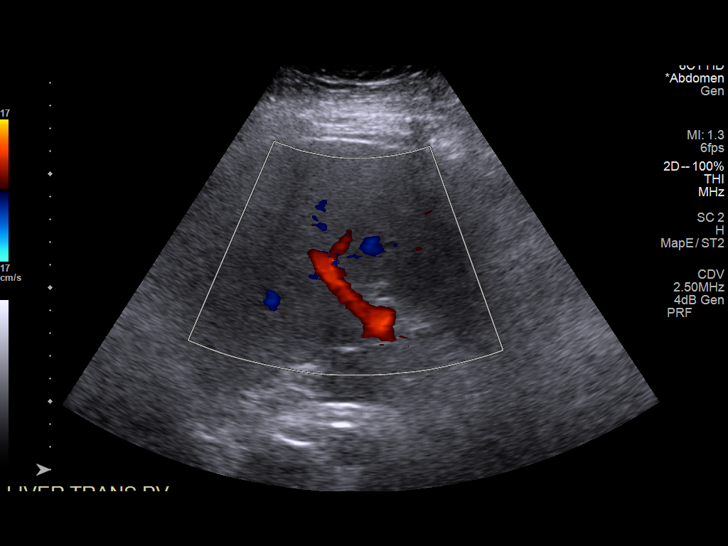
[im 48/48]
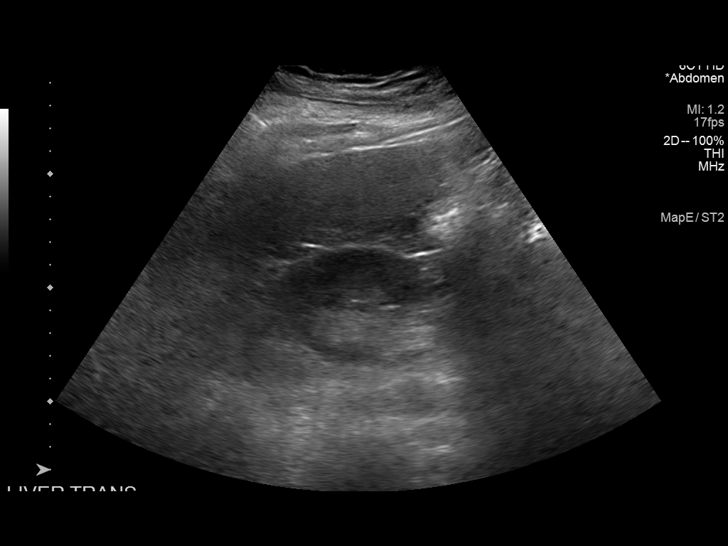

[14 of 25 positions shown; findings below may reference images not displayed]

FINDINGS: Gallbladder:

No gallstones or wall thickening visualized. No sonographic Murphy
sign noted by sonographer.

Common bile duct:

Diameter: 5 mm

Liver:

Increased echogenicity. No focal lesion. Portal vein is patent on
color Doppler imaging with normal direction of blood flow towards
the liver.

Other: None.
IMPRESSION: Increased hepatic parenchymal echogenicity suggestive of steatosis.

No cholelithiasis or sonographic evidence for acute cholecystitis.
